# Patient Record
Sex: Male | Born: 1960 | Race: White | Hispanic: No | Marital: Married | Smoking: Never smoker
Health system: Southern US, Community
[De-identification: ages and names within clinical notes are randomized; demographics above are authoritative.]

## PROBLEM LIST (undated history)

## (undated) DIAGNOSIS — E669 Obesity, unspecified: Secondary | ICD-10-CM

## (undated) DIAGNOSIS — E782 Mixed hyperlipidemia: Secondary | ICD-10-CM

## (undated) DIAGNOSIS — F419 Anxiety disorder, unspecified: Secondary | ICD-10-CM

## (undated) DIAGNOSIS — K589 Irritable bowel syndrome without diarrhea: Secondary | ICD-10-CM

## (undated) DIAGNOSIS — F439 Reaction to severe stress, unspecified: Secondary | ICD-10-CM

## (undated) DIAGNOSIS — K429 Umbilical hernia without obstruction or gangrene: Secondary | ICD-10-CM

## (undated) DIAGNOSIS — I10 Essential (primary) hypertension: Secondary | ICD-10-CM

## (undated) HISTORY — DX: Obesity, unspecified: E66.9

## (undated) HISTORY — PX: COLONOSCOPY: SHX174

## (undated) HISTORY — PX: OTHER SURGICAL HISTORY: SHX169

## (undated) HISTORY — DX: Umbilical hernia without obstruction or gangrene: K42.9

## (undated) HISTORY — DX: Mixed hyperlipidemia: E78.2

## (undated) HISTORY — DX: Reaction to severe stress, unspecified: F43.9

## (undated) HISTORY — DX: Essential (primary) hypertension: I10

---

## 2014-12-24 ENCOUNTER — Telehealth: Payer: Self-pay

## 2014-12-24 NOTE — Telephone Encounter (Signed)
Scheduling not able to get in touch with patient. Chart prep team has records from [facility]

## 2015-03-18 ENCOUNTER — Encounter: Payer: Self-pay | Admitting: *Deleted

## 2015-03-22 ENCOUNTER — Ambulatory Visit (INDEPENDENT_AMBULATORY_CARE_PROVIDER_SITE_OTHER): Payer: BLUE CROSS/BLUE SHIELD | Admitting: Cardiology

## 2015-03-22 ENCOUNTER — Encounter: Payer: Self-pay | Admitting: Cardiology

## 2015-03-22 VITALS — BP 170/100 | HR 69 | Ht 69.5 in | Wt 236.8 lb

## 2015-03-22 DIAGNOSIS — I10 Essential (primary) hypertension: Secondary | ICD-10-CM

## 2015-03-22 DIAGNOSIS — R011 Cardiac murmur, unspecified: Secondary | ICD-10-CM | POA: Diagnosis not present

## 2015-03-22 DIAGNOSIS — I450 Right fascicular block: Secondary | ICD-10-CM | POA: Diagnosis not present

## 2015-03-22 DIAGNOSIS — R079 Chest pain, unspecified: Secondary | ICD-10-CM

## 2015-03-22 DIAGNOSIS — I4510 Unspecified right bundle-branch block: Secondary | ICD-10-CM | POA: Insufficient documentation

## 2015-03-22 MED ORDER — METOPROLOL SUCCINATE ER 25 MG PO TB24: 25.0000 mg | ORAL_TABLET | Freq: Every day | ORAL | Status: DC

## 2015-03-22 NOTE — Progress Notes (Signed)
Cardiology Office Note   Date:  03/22/2015   ID:  Brandon Watkins, DOB 09/09/1961, MRN [id number]  PCP:  Gennette Pac, MD  Cardiologist: Darlin Coco MD  No chief complaint on file.     History of Present Illness: Brandon Watkins is a 54 y.o. male who presents for cardiovascular evaluation.  The patient is a medical patient of Dr. Hulan Fess.  He was recently seen by Dr. Yaakov Guthrie.  He comes in because of his concern over occasional left-sided chest discomfort.  It does not appear to be related to physical exertion.  He wonders if it could be related to stress.  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  He has a history of high blood pressure and has been on high blood pressure medicine for the past 2 years.  He also has a history of significant hypercholesterolemia in the past.  His lipid values have improved on a combination of atorvastatin and ezetimibe.  Patient has also had occasional sharp pains in the right side of his neck.  He complains that it feels like his heart is having to work too hard.  He is a former Printmaker.  He estimates that he has gained about 40 pounds in the past 5 years.  Years ago he was running marathons.  He still works out with a Clinical research associate on a regular basis and tries to do aerobic exercise in the gym on a regular basis.  He has had occasional racing of his heart.  He has been told recently of the heart murmur.  He has not had any episodes of syncope. His family history reveals that his father is living but had bypass surgery.  His mother is alive and has high blood pressure and hypercholesterolemia.    Past Medical History  Diagnosis Date  . Mixed dyslipidemia   . Essential hypertension   . Umbilical hernia   . Obesity   . Stress     Past Surgical History  Procedure Laterality Date  . Repair of digital nerve    . Colonoscopy       Current Outpatient Prescriptions    Medication Sig Dispense Refill  . Ascorbic Acid (VITAMIN C) 1000 MG tablet Take 3,000 mg by mouth daily.    Marland Kitchen aspirin 81 MG chewable tablet Chew 81 mg by mouth daily.    Marland Kitchen atorvastatin (LIPITOR) 40 MG tablet Take 40 mg by mouth daily.    . Cholecalciferol 5000 UNITS capsule Take 5,000 Units by mouth 3 (three) times a week.    . Coenzyme Q10 (CO Q-10) 100 MG CAPS Take 100 mg by mouth daily.    Marland Kitchen DHEA 10 MG TABS Take 10 mg by mouth daily.    Marland Kitchen escitalopram (LEXAPRO) 10 MG tablet Take 10 mg by mouth daily.    Marland Kitchen ezetimibe (ZETIA) 10 MG tablet Take 10 mg by mouth daily.    Marland Kitchen losartan (COZAAR) 100 MG tablet Take 100 mg by mouth daily.    . Multiple Vitamin (MULTIVITAMIN) tablet Take 1 tablet by mouth daily.    . Omega-3 Fatty Acids (FISH OIL) 1000 MG CAPS Take 1,000 mg by mouth 3 (three) times a week.    Marland Kitchen OVER THE COUNTER MEDICATION Take by mouth 2 (two) times daily. ( GABA CALM) HALF A TABLET BY MOUTH TWICE DAILY    . Probiotic Product (PROBIOTIC DAILY PO) Take by mouth daily. ULTRA FLORA    .  metoprolol succinate (TOPROL XL) 25 MG 24 hr tablet Take 1 tablet (25 mg total) by mouth daily. 90 tablet 1   No current facility-administered medications for this visit.    Allergies:   Lipitor and Prednisone    Social History:  The patient  reports that he has never smoked. He does not have any smokeless tobacco history on file. He reports that he drinks alcohol. He reports that he does not use illicit drugs.   Family History:  The patient's family history includes Diabetes in his sister; Hyperlipidemia in his mother; Hypertension in his father and mother.    ROS:  Please see the history of present illness.   Otherwise, review of systems are positive for none.   All other systems are reviewed and negative.    PHYSICAL EXAM: VS:  BP 170/100 mmHg  Pulse 69  Ht 5' 9.5" (1.765 m)  Wt 236 lb 12.8 oz (107.412 kg)  BMI 34.48 kg/m2 , BMI Body mass index is 34.48 kg/(m^2). GEN: Well nourished, well  developed, in no acute distress HEENT: normal Neck: no JVD, carotid bruits, or masses Cardiac: RRR; there is a soft systolic ejection murmur at the base.  No diastolic murmur.  No gallop or rub.  Pedal pulses are strong. Respiratory:  clear to auscultation bilaterally, normal work of breathing GI: soft, nontender, nondistended, + BS MS: no deformity or atrophy Skin: warm and dry, no rash Neuro:  Strength and sensation are intact Psych: euthymic mood, full affect   EKG:  EKG is ordered today. The ekg ordered today demonstrates normal sinus rhythm.  Incomplete right bundle branch block.   Recent Labs: No results found for requested labs within last 365 days.    Lipid Panel No results found for: CHOL, TRIG, HDL, CHOLHDL, VLDL, LDLCALC, LDLDIRECT    Wt Readings from Last 3 Encounters:  03/22/15 236 lb 12.8 oz (107.412 kg)      Other studies Reviewed: Additional studies/ records that were reviewed today include: Outside records from [facility] dated 02/15/15. Review of the above records demonstrates: Normal renal function.   ASSESSMENT AND PLAN:  1.  Atypical chest pain with multiple risk factors for premature coronary disease) hypercholesterolemia and essential hypertension.  The patient is a nonsmoker. 2.  Hypercholesterolemia on atorvastatin and ezetimibe 3.  Intermittent palpitations and sensation of forceful heartbeat typically occurring at rest 4.  Incomplete right bundle branch block 5.  Systolic heart murmur loudest at the aortic area.  Current medicines are reviewed at length with the patient today.  The patient does not have concerns regarding medicines.  The following changes have been made:  Because of blood pressure still not down to go we have added Toprol-XL 25 mg one daily to help his blood pressure and his sense of forceful heartbeat.  Labs/ tests ordered today include:   Orders Placed This Encounter  Procedures  . Myocardial Perfusion Imaging  .  EKG 12-Lead  . Echocardiogram    Disposition: Continue losartan 100 mg daily and start metoprolol succinate 25 mg daily.  Avoid added salt in diet. Return for a two-dimensional echocardiogram to evaluate his heart murmur. Return for a treadmill Myoview stress test to evaluate his left-sided chest discomfort. Recheck in 6 weeks or sooner when necessary Continue baby aspirin 81 mg daily  Signed, Darlin Coco MD 03/22/2015 4:50 PM    [address], [place], [place]  [address] Phone: [phone]; Fax: [phone]

## 2015-03-22 NOTE — Patient Instructions (Signed)
Medication Instructions:  START TOPROL XL (METOPROLOL) 25 MG DAILY  Labwork: NONE  Testing/Procedures: Your physician has requested that you have an echocardiogram. Echocardiography is a painless test that uses sound waves to create images of your heart. It provides your doctor with information about the size and shape of your heart and how well your heart's chambers and valves are working. This procedure takes approximately one hour. There are no restrictions for this procedure.  Your physician has requested that you have en exercise stress myoview. For further information please visit [email or web address]. Please follow instruction sheet, as given.  Follow-Up: 6 WEEKS OV   Any Other Special Instructions Will Be Listed Below (If Applicable). Low-Sodium Eating Plan Sodium raises blood pressure and causes water to be held in the body. Getting less sodium from food will help lower your blood pressure, reduce any swelling, and protect your heart, liver, and kidneys. We get sodium by adding salt (sodium chloride) to food. Most of our sodium comes from canned, boxed, and frozen foods. Restaurant foods, fast foods, and pizza are also very high in sodium. Even if you take medicine to lower your blood pressure or to reduce fluid in your body, getting less sodium from your food is important. WHAT IS MY PLAN? Most people should limit their sodium intake to 2,300 mg a day. Your health care provider recommends that you limit your sodium intake to __________ a day.  WHAT DO I NEED TO KNOW ABOUT THIS EATING PLAN? For the low-sodium eating plan, you will follow these general guidelines:  Choose foods with a % Daily Value for sodium of less than 5% (as listed on the food label).   Use salt-free seasonings or herbs instead of table salt or sea salt.   Check with your health care provider or pharmacist before using salt substitutes.   Eat fresh foods.  Eat more vegetables and fruits.  Limit canned  vegetables. If you do use them, rinse them well to decrease the sodium.   Limit cheese to 1 oz (28 g) per day.   Eat lower-sodium products, often labeled as "lower sodium" or "no salt added."  Avoid foods that contain monosodium glutamate (MSG). MSG is sometimes added to [place] food and some canned foods.  Check food labels (Nutrition Facts labels) on foods to learn how much sodium is in one serving.  Eat more home-cooked food and less restaurant, buffet, and fast food.  When eating at a restaurant, ask that your food be prepared with less salt or none, if possible.  HOW DO I READ FOOD LABELS FOR SODIUM INFORMATION? The Nutrition Facts label lists the amount of sodium in one serving of the food. If you eat more than one serving, you must multiply the listed amount of sodium by the number of servings. Food labels may also identify foods as:  Sodium free--Less than 5 mg in a serving.  Very low sodium--35 mg or less in a serving.  Low sodium--140 mg or less in a serving.  Light in sodium--50% less sodium in a serving. For example, if a food that usually has 300 mg of sodium is changed to become light in sodium, it will have 150 mg of sodium.  Reduced sodium--25% less sodium in a serving. For example, if a food that usually has 400 mg of sodium is changed to reduced sodium, it will have 300 mg of sodium. WHAT FOODS CAN I EAT? Grains Low-sodium cereals, including oats, puffed wheat and rice, and shredded wheat cereals. Low-sodium  crackers. Unsalted rice and pasta. Lower-sodium bread.  Vegetables Frozen or fresh vegetables. Low-sodium or reduced-sodium canned vegetables. Low-sodium or reduced-sodium tomato sauce and paste. Low-sodium or reduced-sodium tomato and vegetable juices.  Fruits Fresh, frozen, and canned fruit. Fruit juice.  Meat and Other Protein Products Low-sodium canned tuna and salmon. Fresh or frozen meat, poultry, seafood, and fish. Lamb. Unsalted nuts. Dried  beans, peas, and lentils without added salt. Unsalted canned beans. Homemade soups without salt. Eggs.  Dairy Milk. Soy milk. Ricotta cheese. Low-sodium or reduced-sodium cheeses. Yogurt.  Condiments Fresh and dried herbs and spices. Salt-free seasonings. Onion and garlic powders. Low-sodium varieties of mustard and ketchup. Lemon juice.  Fats and Oils Reduced-sodium salad dressings. Unsalted butter.  Other Unsalted popcorn and pretzels.  The items listed above may not be a complete list of recommended foods or beverages. Contact your dietitian for more options. WHAT FOODS ARE NOT RECOMMENDED? Grains Instant hot cereals. Bread stuffing, pancake, and biscuit mixes. Croutons. Seasoned rice or pasta mixes. Noodle soup cups. Boxed or frozen macaroni and cheese. Self-rising flour. Regular salted crackers. Vegetables Regular canned vegetables. Regular canned tomato sauce and paste. Regular tomato and vegetable juices. Frozen vegetables in sauces. Salted french fries. Olives. Angie Fava. Relishes. Sauerkraut. Salsa. Meat and Other Protein Products Salted, canned, smoked, spiced, or pickled meats, seafood, or fish. Bacon, ham, sausage, hot dogs, corned beef, chipped beef, and packaged luncheon meats. Salt pork. Jerky. Pickled herring. Anchovies, regular canned tuna, and sardines. Salted nuts. Dairy Processed cheese and cheese spreads. Cheese curds. Blue cheese and cottage cheese. Buttermilk.  Condiments Onion and garlic salt, seasoned salt, table salt, and sea salt. Canned and packaged gravies. Worcestershire sauce. Tartar sauce. Barbecue sauce. Teriyaki sauce. Soy sauce, including reduced sodium. Steak sauce. Fish sauce. Oyster sauce. Cocktail sauce. Horseradish. Regular ketchup and mustard. Meat flavorings and tenderizers. Bouillon cubes. Hot sauce. Tabasco sauce. Marinades. Taco seasonings. Relishes. Fats and Oils Regular salad dressings. Salted butter. Margarine. Ghee. Bacon fat.   Other Potato and tortilla chips. Corn chips and puffs. Salted popcorn and pretzels. Canned or dried soups. Pizza. Frozen entrees and pot pies.  The items listed above may not be a complete list of foods and beverages to avoid. Contact your dietitian for more information. Document Released: 04/20/2002 Document Revised: 11/03/2013 Document Reviewed: 09/02/2013 [facility] Patient Information 2015 [place], [place]. This information is not intended to replace advice given to you by your health care provider. Make sure you discuss any questions you have with your health care provider.

## 2015-04-04 ENCOUNTER — Telehealth (HOSPITAL_COMMUNITY): Payer: Self-pay

## 2015-04-04 NOTE — Telephone Encounter (Signed)
Left message on voicemail in reference to upcoming appointment scheduled for 04-05-2015. Phone number given for a call back so details instructions can be given. Brandon Watkins, [name]

## 2015-04-05 ENCOUNTER — Ambulatory Visit (HOSPITAL_COMMUNITY): Payer: BLUE CROSS/BLUE SHIELD | Attending: Cardiovascular Disease

## 2015-04-05 ENCOUNTER — Other Ambulatory Visit: Payer: Self-pay

## 2015-04-05 ENCOUNTER — Ambulatory Visit (HOSPITAL_BASED_OUTPATIENT_CLINIC_OR_DEPARTMENT_OTHER): Payer: BLUE CROSS/BLUE SHIELD

## 2015-04-05 DIAGNOSIS — R011 Cardiac murmur, unspecified: Secondary | ICD-10-CM

## 2015-04-05 DIAGNOSIS — Z8249 Family history of ischemic heart disease and other diseases of the circulatory system: Secondary | ICD-10-CM | POA: Insufficient documentation

## 2015-04-05 DIAGNOSIS — R079 Chest pain, unspecified: Secondary | ICD-10-CM | POA: Diagnosis not present

## 2015-04-05 DIAGNOSIS — E785 Hyperlipidemia, unspecified: Secondary | ICD-10-CM | POA: Diagnosis not present

## 2015-04-05 DIAGNOSIS — I4510 Unspecified right bundle-branch block: Secondary | ICD-10-CM | POA: Diagnosis not present

## 2015-04-05 DIAGNOSIS — I10 Essential (primary) hypertension: Secondary | ICD-10-CM | POA: Insufficient documentation

## 2015-04-05 LAB — MYOCARDIAL PERFUSION IMAGING
CHL CUP MPHR: 167 {beats}/min
CHL CUP RESTING HR STRESS: 69 {beats}/min
CHL CUP STRESS STAGE 1 SBP: 138 mmHg
CHL CUP STRESS STAGE 10 DBP: 86 mmHg
CHL CUP STRESS STAGE 2 SBP: 151 mmHg
CHL CUP STRESS STAGE 2 SPEED: 0 mph
CHL CUP STRESS STAGE 3 SPEED: 0 mph
CHL CUP STRESS STAGE 4 SPEED: 1.7 mph
CHL CUP STRESS STAGE 5 DBP: 85 mmHg
CHL CUP STRESS STAGE 5 SPEED: 2.5 mph
CHL CUP STRESS STAGE 6 GRADE: 14 %
CHL CUP STRESS STAGE 7 SPEED: 4.2 mph
CHL CUP STRESS STAGE 8 HR: 148 {beats}/min
CHL CUP STRESS STAGE 9 DBP: 108 mmHg
CHL CUP STRESS STAGE 9 SBP: 229 mmHg
CSEPHR: 88 %
CSEPPMHR: 88 %
Estimated workload: 14.3 METS
Exercise duration (min): 12 min
Exercise duration (sec): 30 s
LHR: 0.36
LV dias vol: 119 mL
LVSYSVOL: 44 mL
Nuc Stress EF: 63 %
Peak HR: 148 {beats}/min
RPE: 19
SDS: 2
SRS: 4
SSS: 6
Stage 1 DBP: 91 mmHg
Stage 1 Grade: 0 %
Stage 1 HR: 69 {beats}/min
Stage 1 Speed: 0 mph
Stage 10 Grade: 0 %
Stage 10 HR: 85 {beats}/min
Stage 10 SBP: 145 mmHg
Stage 10 Speed: 0 mph
Stage 2 DBP: 96 mmHg
Stage 2 Grade: 0 %
Stage 2 HR: 65 {beats}/min
Stage 3 Grade: 0 %
Stage 3 HR: 64 {beats}/min
Stage 4 Grade: 10 %
Stage 4 HR: 85 {beats}/min
Stage 5 Grade: 12 %
Stage 5 HR: 98 {beats}/min
Stage 5 SBP: 178 mmHg
Stage 6 DBP: 92 mmHg
Stage 6 HR: 120 {beats}/min
Stage 6 SBP: 213 mmHg
Stage 6 Speed: 3.4 mph
Stage 7 Grade: 16 %
Stage 7 HR: 144 {beats}/min
Stage 8 Grade: 18 %
Stage 8 Speed: 5 mph
Stage 9 Grade: 0 %
Stage 9 HR: 126 {beats}/min
Stage 9 Speed: 0 mph
TID: 0.72

## 2015-04-05 MED ORDER — TECHNETIUM TC 99M SESTAMIBI GENERIC - CARDIOLITE
11.0000 | Freq: Once | INTRAVENOUS | Status: AC | PRN
  Administered 2015-04-05: 11 via INTRAVENOUS

## 2015-04-05 MED ORDER — TECHNETIUM TC 99M SESTAMIBI GENERIC - CARDIOLITE
33.0000 | Freq: Once | INTRAVENOUS | Status: AC | PRN
  Administered 2015-04-05: 33 via INTRAVENOUS

## 2015-04-06 ENCOUNTER — Telehealth: Payer: Self-pay | Admitting: *Deleted

## 2015-04-06 NOTE — Telephone Encounter (Signed)
Advised patient of echo and myoview

## 2015-04-06 NOTE — Telephone Encounter (Signed)
-----   Message from Darlin Coco, MD sent at 04/05/2015  6:46 PM EDT ----- Please report.  The nuclear stress test is normal.  Exercise tolerance was excellent.  No evidence of ischemia.  Send a copy of the report to Dr. Hulan Fess.

## 2015-04-06 NOTE — Telephone Encounter (Signed)
-----   Message from Darlin Coco, MD sent at 04/05/2015 12:57 PM EDT ----- Please report.  The echocardiogram is normal.  No significant valve problems.  Left ventricular function is normal.  Please send a copy  to Dr. Hulan Fess. We are still awaiting his stress test.

## 2015-05-06 ENCOUNTER — Ambulatory Visit: Payer: BLUE CROSS/BLUE SHIELD | Admitting: Cardiology

## 2015-11-27 ENCOUNTER — Other Ambulatory Visit: Payer: Self-pay | Admitting: Cardiology

## 2015-12-16 ENCOUNTER — Ambulatory Visit (INDEPENDENT_AMBULATORY_CARE_PROVIDER_SITE_OTHER): Payer: BLUE CROSS/BLUE SHIELD | Admitting: Cardiology

## 2015-12-16 ENCOUNTER — Encounter: Payer: Self-pay | Admitting: Cardiology

## 2015-12-16 VITALS — BP 140/90 | HR 72 | Ht 69.5 in | Wt 243.1 lb

## 2015-12-16 DIAGNOSIS — E7800 Pure hypercholesterolemia, unspecified: Secondary | ICD-10-CM

## 2015-12-16 DIAGNOSIS — I10 Essential (primary) hypertension: Secondary | ICD-10-CM

## 2015-12-16 MED ORDER — METOPROLOL SUCCINATE ER 25 MG PO TB24: 25.0000 mg | ORAL_TABLET | Freq: Every day | ORAL | Status: DC

## 2015-12-16 NOTE — Patient Instructions (Signed)
Medication Instructions:  Your physician recommends that you continue on your current medications as directed. Please refer to the Current Medication list given to you today.  Labwork: none  Testing/Procedures: none  Follow-Up: As needed   If you need a refill on your cardiac medications before your next appointment, please call your pharmacy.  

## 2015-12-16 NOTE — Progress Notes (Signed)
Cardiology Office Note   Date:  12/16/2015   ID:  Brandon Watkins, DOB [birth year], MRN [id number]  PCP:  Gennette Pac, MD  Cardiologist: Darlin Coco MD  No chief complaint on file.     History of Present Illness: Brandon Watkins is a 55 y.o. male who presents for X month follow-up office visit   The patient is a medical patient of Dr. Hulan Fess. Marland Kitchen He came in in May 2016 because of his concern over occasional left-sided chest discomfort. It does not appear to be related to physical exertion.  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 He has a history of high blood pressure and has been on high blood pressure medicine for the past 2 years. He also has a history of significant hypercholesterolemia in the past.He reports that he has had a good response of his serum cholesterol to Lipitor.  He is no longer on ezetimibe.  He is not having any myalgias from the statin therapy. He is not having any exertional chest pain.  His blood pressure at home has occasionally been in the 160/90 range.  He has been out of his beta blocker for the past 2 weeks. His family history reveals that his father is living but had bypass surgery. His mother is alive and has high blood pressure and hypercholesterolemia.  Past Medical History  Diagnosis Date  . Mixed dyslipidemia   . Essential hypertension   . Umbilical hernia   . Obesity   . Stress     Past Surgical History  Procedure Laterality Date  . Repair of digital nerve    . Colonoscopy       Current Outpatient Prescriptions  Medication Sig Dispense Refill  . aspirin 81 MG chewable tablet Chew 81 mg by mouth daily.    Marland Kitchen atorvastatin (LIPITOR) 40 MG tablet Take 40 mg by mouth daily.    Marland Kitchen escitalopram (LEXAPRO) 10 MG tablet Take 10 mg by mouth daily.    Marland Kitchen losartan-hydrochlorothiazide (HYZAAR) 100-25 MG tablet Take 1 tablet by mouth daily.   0  . metoprolol  succinate (TOPROL XL) 25 MG 24 hr tablet Take 1 tablet (25 mg total) by mouth daily. 90 tablet 3  . Multiple Vitamin (MULTIVITAMIN) tablet Take 1 tablet by mouth daily.    . Omega-3 Fatty Acids (FISH OIL) 1000 MG CAPS Take 1,000 mg by mouth 3 (three) times a week.    Marland Kitchen OVER THE COUNTER MEDICATION Take 1 Dose by mouth daily. ( GABA CALM)     No current facility-administered medications for this visit.    Allergies:   Lipitor and Prednisone    Social History:  The patient  reports that he has never smoked. He does not have any smokeless tobacco history on file. He reports that he drinks alcohol. He reports that he does not use illicit drugs.   Family History:  The patient's family history includes Diabetes in his sister; Hyperlipidemia in his mother; Hypertension in his father and mother.    ROS:  Please see the history of present illness.   Otherwise, review of systems are positive for none.   All other systems are reviewed and negative.    PHYSICAL EXAM: VS:  BP 140/90 mmHg  Pulse 72  Ht 5' 9.5" (1.765 m)  Wt 243 lb 1.9 oz (110.279 kg)  BMI 35.40 kg/m2 , BMI Body mass index is 35.4 kg/(m^2). GEN: Well nourished,  well developed, in no acute distress HEENT: normal Neck: no JVD, carotid bruits, or masses Cardiac: RRR; no murmurs, rubs, or gallops,no edema  Respiratory:  clear to auscultation bilaterally, normal work of breathing GI: soft, nontender, nondistended, + BS MS: no deformity or atrophy Skin: warm and dry, no rash Neuro:  Strength and sensation are intact Psych: euthymic mood, full affect   EKG:  EKG is not ordered today.    Recent Labs: No results found for requested labs within last 365 days.    Lipid Panel No results found for: CHOL, TRIG, HDL, CHOLHDL, VLDL, LDLCALC, LDLDIRECT    Wt Readings from Last 3 Encounters:  12/16/15 243 lb 1.9 oz (110.279 kg)  04/05/15 238 lb (107.956 kg)  03/22/15 236 lb 12.8 oz (107.412 kg)      Other studies  Reviewed: Additional studies/ records that were reviewed today include: . Review of the above records demonstrates:  Echocardiogram on 04/05/15: - Left ventricle: The cavity size was normal. Systolic function was normal. The estimated ejection fraction was in the range of 50% to 55%. Wall motion was normal; there were no regional wall motion abnormalities. - Atrial septum: No defect or patent foramen ovale was identified.  Treadmill Myoview on 04/05/15:  Myocardial perfusion is normal. The study is normal. This is a low risk study. Overall left ventricular systolic function was normal. The left ventricular ejection fraction is normal (55-65%). No ischemia identified  Excellent exercise tolerance. Normal exercise treadmill test.   ASSESSMENT AND PLAN:  1.  Atypical chest pain, improved with better control of blood pressure.  Previous Myoview and echocardiogram unremarkable 2.  Essential hypertension.  Blood pressure today 140/90 having not been on any beta blocker for 2 weeks.  Resume beta blocker in the form of Toprol-XL 25 one daily.  Continue close follow-up of blood pressure with Dr. Rex Kras 3.  Hypercholesterolemia.  Improving on statin therapy.  Continue with statin as per Dr. Rex Kras   Current medicines are reviewed at length with the patient today.  The patient does not have concerns regarding medicines.  The following changes have been made:  no change  Labs/ tests ordered today include:  No orders of the defined types were placed in this encounter.   We refilled his metoprolol succinate today. Not have any evidence of coronary disease.  He will continue close follow-up with Dr. Rex Kras and return here on a when necessary basis. His weight is up 7 pounds since last year and I encouraged him to try to lose weight and to continue regular exercise.  Berna Spare MD 12/16/2015 5:32 PM    [address], [place], [place]   [address] Phone: [phone]; Fax: [phone]

## 2015-12-23 ENCOUNTER — Ambulatory Visit: Payer: BLUE CROSS/BLUE SHIELD | Admitting: Cardiology

## 2018-12-16 DIAGNOSIS — F411 Generalized anxiety disorder: Secondary | ICD-10-CM | POA: Diagnosis not present

## 2018-12-16 DIAGNOSIS — E785 Hyperlipidemia, unspecified: Secondary | ICD-10-CM | POA: Diagnosis not present

## 2018-12-16 DIAGNOSIS — I10 Essential (primary) hypertension: Secondary | ICD-10-CM | POA: Diagnosis not present

## 2018-12-24 DIAGNOSIS — K429 Umbilical hernia without obstruction or gangrene: Secondary | ICD-10-CM | POA: Diagnosis not present

## 2019-01-01 ENCOUNTER — Ambulatory Visit: Payer: Self-pay | Admitting: General Surgery

## 2019-01-29 DIAGNOSIS — K625 Hemorrhage of anus and rectum: Secondary | ICD-10-CM | POA: Diagnosis not present

## 2019-01-29 DIAGNOSIS — K51811 Other ulcerative colitis with rectal bleeding: Secondary | ICD-10-CM | POA: Diagnosis not present

## 2019-02-02 DIAGNOSIS — K921 Melena: Secondary | ICD-10-CM | POA: Diagnosis not present

## 2019-02-09 DIAGNOSIS — K5289 Other specified noninfective gastroenteritis and colitis: Secondary | ICD-10-CM | POA: Diagnosis not present

## 2019-02-09 DIAGNOSIS — K625 Hemorrhage of anus and rectum: Secondary | ICD-10-CM | POA: Diagnosis not present

## 2019-02-09 DIAGNOSIS — K626 Ulcer of anus and rectum: Secondary | ICD-10-CM | POA: Diagnosis not present

## 2019-02-09 DIAGNOSIS — K633 Ulcer of intestine: Secondary | ICD-10-CM | POA: Diagnosis not present

## 2019-02-17 DIAGNOSIS — K5289 Other specified noninfective gastroenteritis and colitis: Secondary | ICD-10-CM | POA: Diagnosis not present

## 2019-02-17 DIAGNOSIS — K6289 Other specified diseases of anus and rectum: Secondary | ICD-10-CM | POA: Diagnosis not present

## 2019-03-25 ENCOUNTER — Encounter (HOSPITAL_BASED_OUTPATIENT_CLINIC_OR_DEPARTMENT_OTHER): Payer: Self-pay | Admitting: *Deleted

## 2019-03-25 ENCOUNTER — Other Ambulatory Visit: Payer: Self-pay

## 2019-03-26 ENCOUNTER — Other Ambulatory Visit: Payer: Self-pay

## 2019-03-26 ENCOUNTER — Other Ambulatory Visit (HOSPITAL_COMMUNITY)
Admission: RE | Admit: 2019-03-26 | Discharge: 2019-03-26 | Disposition: A | Discharge status: Home / Self Care | Payer: BLUE CROSS/BLUE SHIELD | Source: Ambulatory Visit | Attending: General Surgery | Admitting: General Surgery

## 2019-03-26 ENCOUNTER — Encounter (HOSPITAL_BASED_OUTPATIENT_CLINIC_OR_DEPARTMENT_OTHER)
Admission: RE | Admit: 2019-03-26 | Discharge: 2019-03-26 | Disposition: A | Discharge status: Home / Self Care | Payer: BLUE CROSS/BLUE SHIELD | Source: Ambulatory Visit | Attending: General Surgery | Admitting: General Surgery

## 2019-03-26 DIAGNOSIS — Z1159 Encounter for screening for other viral diseases: Secondary | ICD-10-CM | POA: Insufficient documentation

## 2019-03-26 DIAGNOSIS — Z01818 Encounter for other preprocedural examination: Secondary | ICD-10-CM | POA: Insufficient documentation

## 2019-03-26 DIAGNOSIS — I4510 Unspecified right bundle-branch block: Secondary | ICD-10-CM | POA: Insufficient documentation

## 2019-03-26 LAB — BASIC METABOLIC PANEL
Anion gap: 11 (ref 5–15)
BUN: 15 mg/dL (ref 6–20)
CO2: 27 mmol/L (ref 22–32)
Calcium: 9.5 mg/dL (ref 8.9–10.3)
Chloride: 104 mmol/L (ref 98–111)
Creatinine, Ser: 1.14 mg/dL (ref 0.61–1.24)
GFR calc Af Amer: >60 mL/min (ref >60)
GFR calc non Af Amer: >60 mL/min (ref >60)
Glucose, Bld: 88 mg/dL (ref 70–99)
Potassium: 3.9 mmol/L (ref 3.5–5.1)
Sodium: 142 mmol/L (ref 135–145)

## 2019-03-26 NOTE — Progress Notes (Signed)
Pt given and instructed to drink Ensure by 0630 day of surgery with teach back method. Dr Marcell Barlow reviewed EKG - ok for surgery. Explained to pt about right bundle branch block - he was anxious, told him I had to have it reviewed by Anesthesia. Pt a little calmer after.

## 2019-03-27 LAB — NOVEL CORONAVIRUS, NAA (HOSP ORDER, SEND-OUT TO REF LAB; TAT 18-24 HRS): SARS-CoV-2, NAA: NOT DETECTED

## 2019-03-30 ENCOUNTER — Other Ambulatory Visit: Payer: Self-pay

## 2019-03-30 ENCOUNTER — Encounter (HOSPITAL_BASED_OUTPATIENT_CLINIC_OR_DEPARTMENT_OTHER): Payer: Self-pay | Admitting: Certified Registered"

## 2019-03-30 ENCOUNTER — Ambulatory Visit (HOSPITAL_BASED_OUTPATIENT_CLINIC_OR_DEPARTMENT_OTHER): Payer: BLUE CROSS/BLUE SHIELD | Admitting: Certified Registered"

## 2019-03-30 ENCOUNTER — Ambulatory Visit (HOSPITAL_BASED_OUTPATIENT_CLINIC_OR_DEPARTMENT_OTHER)
Admission: RE | Admit: 2019-03-30 | Discharge: 2019-03-30 | Disposition: A | Discharge status: Home / Self Care | Payer: BLUE CROSS/BLUE SHIELD | Attending: General Surgery | Admitting: General Surgery

## 2019-03-30 ENCOUNTER — Encounter (HOSPITAL_BASED_OUTPATIENT_CLINIC_OR_DEPARTMENT_OTHER)
Admission: RE | Disposition: A | Discharge status: Home / Self Care | Payer: Self-pay | Source: Home / Self Care | Attending: General Surgery

## 2019-03-30 DIAGNOSIS — K429 Umbilical hernia without obstruction or gangrene: Secondary | ICD-10-CM | POA: Insufficient documentation

## 2019-03-30 DIAGNOSIS — E7800 Pure hypercholesterolemia, unspecified: Secondary | ICD-10-CM | POA: Diagnosis not present

## 2019-03-30 DIAGNOSIS — I10 Essential (primary) hypertension: Secondary | ICD-10-CM | POA: Insufficient documentation

## 2019-03-30 DIAGNOSIS — Z6834 Body mass index (BMI) 34.0-34.9, adult: Secondary | ICD-10-CM | POA: Insufficient documentation

## 2019-03-30 DIAGNOSIS — Z79899 Other long term (current) drug therapy: Secondary | ICD-10-CM | POA: Diagnosis not present

## 2019-03-30 DIAGNOSIS — E669 Obesity, unspecified: Secondary | ICD-10-CM | POA: Insufficient documentation

## 2019-03-30 DIAGNOSIS — F419 Anxiety disorder, unspecified: Secondary | ICD-10-CM | POA: Insufficient documentation

## 2019-03-30 DIAGNOSIS — Z8249 Family history of ischemic heart disease and other diseases of the circulatory system: Secondary | ICD-10-CM | POA: Insufficient documentation

## 2019-03-30 DIAGNOSIS — I4510 Unspecified right bundle-branch block: Secondary | ICD-10-CM | POA: Diagnosis not present

## 2019-03-30 DIAGNOSIS — E785 Hyperlipidemia, unspecified: Secondary | ICD-10-CM | POA: Insufficient documentation

## 2019-03-30 HISTORY — DX: Anxiety disorder, unspecified: F41.9

## 2019-03-30 HISTORY — PX: UMBILICAL HERNIA REPAIR: SHX196

## 2019-03-30 HISTORY — DX: Irritable bowel syndrome, unspecified: K58.9

## 2019-03-30 SURGERY — REPAIR, HERNIA, UMBILICAL, ADULT
Anesthesia: General | Site: Abdomen

## 2019-03-30 MED ORDER — LIDOCAINE 2% (20 MG/ML) 5 ML SYRINGE
INTRAMUSCULAR | Status: DC | PRN
  Administered 2019-03-30: 80 mg via INTRAVENOUS

## 2019-03-30 MED ORDER — EPHEDRINE SULFATE-NACL 50-0.9 MG/10ML-% IV SOSY
PREFILLED_SYRINGE | INTRAVENOUS | Status: DC | PRN
  Administered 2019-03-30 (×3): 10 mg via INTRAVENOUS

## 2019-03-30 MED ORDER — PHENYLEPHRINE 40 MCG/ML (10ML) SYRINGE FOR IV PUSH (FOR BLOOD PRESSURE SUPPORT)
PREFILLED_SYRINGE | INTRAVENOUS | Status: DC | PRN
  Administered 2019-03-30: 40 ug via INTRAVENOUS
  Administered 2019-03-30: 80 ug via INTRAVENOUS

## 2019-03-30 MED ORDER — GABAPENTIN 300 MG PO CAPS
300.0000 mg | ORAL_CAPSULE | ORAL | Status: AC
  Administered 2019-03-30: 300 mg via ORAL

## 2019-03-30 MED ORDER — FENTANYL CITRATE (PF) 100 MCG/2ML IJ SOLN
INTRAMUSCULAR | Status: DC | PRN
  Administered 2019-03-30 (×2): 50 ug via INTRAVENOUS

## 2019-03-30 MED ORDER — CEFAZOLIN SODIUM-DEXTROSE 2-4 GM/100ML-% IV SOLN
2.0000 g | INTRAVENOUS | Status: AC
  Administered 2019-03-30: 2 g via INTRAVENOUS

## 2019-03-30 MED ORDER — MIDAZOLAM HCL 2 MG/2ML IJ SOLN: 1.0000 mg | INTRAMUSCULAR | Status: DC | PRN

## 2019-03-30 MED ORDER — PHENYLEPHRINE 40 MCG/ML (10ML) SYRINGE FOR IV PUSH (FOR BLOOD PRESSURE SUPPORT)
PREFILLED_SYRINGE | INTRAVENOUS | Status: AC
  Filled 2019-03-30: qty 30

## 2019-03-30 MED ORDER — BUPIVACAINE-EPINEPHRINE (PF) 0.25% -1:200000 IJ SOLN
INTRAMUSCULAR | Status: AC
  Filled 2019-03-30: qty 30

## 2019-03-30 MED ORDER — LACTATED RINGERS IV SOLN
INTRAVENOUS | Status: DC
  Administered 2019-03-30: 10:00:00 via INTRAVENOUS

## 2019-03-30 MED ORDER — SCOPOLAMINE 1 MG/3DAYS TD PT72
1.0000 | MEDICATED_PATCH | Freq: Once | TRANSDERMAL | Status: DC | PRN
  Administered 2019-03-30: 1.5 mg via TRANSDERMAL

## 2019-03-30 MED ORDER — ROCURONIUM BROMIDE 100 MG/10ML IV SOLN: INTRAVENOUS | Status: DC | PRN

## 2019-03-30 MED ORDER — CHLORHEXIDINE GLUCONATE CLOTH 2 % EX PADS: 6.0000 | MEDICATED_PAD | Freq: Once | CUTANEOUS | Status: DC

## 2019-03-30 MED ORDER — ROCURONIUM BROMIDE 10 MG/ML (PF) SYRINGE
PREFILLED_SYRINGE | INTRAVENOUS | Status: AC
  Filled 2019-03-30: qty 10

## 2019-03-30 MED ORDER — LIDOCAINE-EPINEPHRINE (PF) 1 %-1:200000 IJ SOLN
INTRAMUSCULAR | Status: AC
  Filled 2019-03-30: qty 30

## 2019-03-30 MED ORDER — MIDAZOLAM HCL 5 MG/5ML IJ SOLN
INTRAMUSCULAR | Status: DC | PRN
  Administered 2019-03-30: 2 mg via INTRAVENOUS

## 2019-03-30 MED ORDER — BUPIVACAINE HCL (PF) 0.25 % IJ SOLN
INTRAMUSCULAR | Status: AC
  Filled 2019-03-30: qty 30

## 2019-03-30 MED ORDER — CEFAZOLIN SODIUM-DEXTROSE 2-4 GM/100ML-% IV SOLN
INTRAVENOUS | Status: AC
  Filled 2019-03-30: qty 100

## 2019-03-30 MED ORDER — FENTANYL CITRATE (PF) 100 MCG/2ML IJ SOLN
INTRAMUSCULAR | Status: AC
  Filled 2019-03-30: qty 2

## 2019-03-30 MED ORDER — LIDOCAINE 2% (20 MG/ML) 5 ML SYRINGE
INTRAMUSCULAR | Status: AC
  Filled 2019-03-30: qty 5

## 2019-03-30 MED ORDER — ACETAMINOPHEN 500 MG PO TABS
ORAL_TABLET | ORAL | Status: AC
  Filled 2019-03-30: qty 2

## 2019-03-30 MED ORDER — ROCURONIUM BROMIDE 100 MG/10ML IV SOLN
INTRAVENOUS | Status: DC | PRN
  Administered 2019-03-30: 50 mg via INTRAVENOUS

## 2019-03-30 MED ORDER — CELECOXIB 200 MG PO CAPS
200.0000 mg | ORAL_CAPSULE | ORAL | Status: AC
  Administered 2019-03-30: 200 mg via ORAL

## 2019-03-30 MED ORDER — GABAPENTIN 300 MG PO CAPS
ORAL_CAPSULE | ORAL | Status: AC
  Filled 2019-03-30: qty 1

## 2019-03-30 MED ORDER — ONDANSETRON HCL 4 MG/2ML IJ SOLN
INTRAMUSCULAR | Status: AC
  Filled 2019-03-30: qty 2

## 2019-03-30 MED ORDER — SUCCINYLCHOLINE CHLORIDE 200 MG/10ML IV SOSY: PREFILLED_SYRINGE | INTRAVENOUS | Status: DC | PRN

## 2019-03-30 MED ORDER — ACETAMINOPHEN 500 MG PO TABS
1000.0000 mg | ORAL_TABLET | ORAL | Status: AC
  Administered 2019-03-30: 10:00:00 1000 mg via ORAL

## 2019-03-30 MED ORDER — HYDROCODONE-ACETAMINOPHEN 5-325 MG PO TABS: 1.0000 | ORAL_TABLET | Freq: Four times a day (QID) | ORAL | 0 refills | Status: DC | PRN

## 2019-03-30 MED ORDER — CELECOXIB 200 MG PO CAPS
ORAL_CAPSULE | ORAL | Status: AC
  Filled 2019-03-30: qty 1

## 2019-03-30 MED ORDER — BUPIVACAINE-EPINEPHRINE 0.25% -1:200000 IJ SOLN
INTRAMUSCULAR | Status: DC | PRN
  Administered 2019-03-30: 10 mL

## 2019-03-30 MED ORDER — EPHEDRINE 5 MG/ML INJ
INTRAVENOUS | Status: AC
  Filled 2019-03-30: qty 10

## 2019-03-30 MED ORDER — SCOPOLAMINE 1 MG/3DAYS TD PT72
MEDICATED_PATCH | TRANSDERMAL | Status: AC
  Filled 2019-03-30: qty 1

## 2019-03-30 MED ORDER — SUGAMMADEX SODIUM 200 MG/2ML IV SOLN
INTRAVENOUS | Status: DC | PRN
  Administered 2019-03-30: 200 mg via INTRAVENOUS

## 2019-03-30 MED ORDER — MIDAZOLAM HCL 2 MG/2ML IJ SOLN
INTRAMUSCULAR | Status: AC
  Filled 2019-03-30: qty 2

## 2019-03-30 MED ORDER — FENTANYL CITRATE (PF) 100 MCG/2ML IJ SOLN: 50.0000 ug | INTRAMUSCULAR | Status: DC | PRN

## 2019-03-30 MED ORDER — SUGAMMADEX SODIUM 500 MG/5ML IV SOLN
INTRAVENOUS | Status: AC
  Filled 2019-03-30: qty 5

## 2019-03-30 MED ORDER — ONDANSETRON HCL 4 MG/2ML IJ SOLN
INTRAMUSCULAR | Status: DC | PRN
  Administered 2019-03-30: 4 mg via INTRAVENOUS

## 2019-03-30 MED ORDER — PROPOFOL 10 MG/ML IV BOLUS
INTRAVENOUS | Status: DC | PRN
  Administered 2019-03-30: 200 mg via INTRAVENOUS

## 2019-03-30 SURGICAL SUPPLY — 46 items
BENZOIN TINCTURE PRP APPL 2/3 (GAUZE/BANDAGES/DRESSINGS) IMPLANT
BLADE CLIPPER SURG (BLADE) ×3 IMPLANT
BLADE SURG 15 STRL LF DISP TIS (BLADE) ×1 IMPLANT
BLADE SURG 15 STRL SS (BLADE) ×2
CHLORAPREP W/TINT 26 (MISCELLANEOUS) ×3 IMPLANT
CLOSURE WOUND 1/2 X4 (GAUZE/BANDAGES/DRESSINGS)
COVER BACK TABLE REUSABLE LG (DRAPES) ×3 IMPLANT
COVER MAYO STAND REUSABLE (DRAPES) ×3 IMPLANT
COVER WAND RF STERILE (DRAPES) IMPLANT
DECANTER SPIKE VIAL GLASS SM (MISCELLANEOUS) ×3 IMPLANT
DERMABOND ADVANCED (GAUZE/BANDAGES/DRESSINGS) ×2
DERMABOND ADVANCED .7 DNX12 (GAUZE/BANDAGES/DRESSINGS) ×1 IMPLANT
DRAPE LAPAROTOMY TRNSV 102X78 (DRAPE) ×3 IMPLANT
DRAPE UTILITY XL STRL (DRAPES) ×3 IMPLANT
DRSG TEGADERM 4X4.75 (GAUZE/BANDAGES/DRESSINGS) IMPLANT
ELECT COATED BLADE 2.86 ST (ELECTRODE) ×3 IMPLANT
ELECT REM PT RETURN 9FT ADLT (ELECTROSURGICAL) ×3
ELECTRODE REM PT RTRN 9FT ADLT (ELECTROSURGICAL) ×1 IMPLANT
GAUZE SPONGE 4X4 12PLY STRL LF (GAUZE/BANDAGES/DRESSINGS) IMPLANT
GLOVE BIO SURGEON STRL SZ7 (GLOVE) ×3 IMPLANT
GLOVE BIO SURGEON STRL SZ7.5 (GLOVE) ×3 IMPLANT
GLOVE BIOGEL PI IND STRL 7.5 (GLOVE) ×1 IMPLANT
GLOVE BIOGEL PI INDICATOR 7.5 (GLOVE) ×2
GOWN STRL REUS W/ TWL LRG LVL3 (GOWN DISPOSABLE) ×2 IMPLANT
GOWN STRL REUS W/TWL LRG LVL3 (GOWN DISPOSABLE) ×4
MESH VENTRALEX ST 1-7/10 CRC S (Mesh General) ×3 IMPLANT
NEEDLE HYPO 22GX1.5 SAFETY (NEEDLE) IMPLANT
NEEDLE HYPO 25X1 1.5 SAFETY (NEEDLE) ×3 IMPLANT
NS IRRIG 1000ML POUR BTL (IV SOLUTION) IMPLANT
PACK BASIN DAY SURGERY FS (CUSTOM PROCEDURE TRAY) ×3 IMPLANT
PENCIL BUTTON HOLSTER BLD 10FT (ELECTRODE) ×3 IMPLANT
SLEEVE SCD COMPRESS KNEE MED (MISCELLANEOUS) IMPLANT
SPONGE LAP 18X18 RF (DISPOSABLE) ×3 IMPLANT
STRIP CLOSURE SKIN 1/2X4 (GAUZE/BANDAGES/DRESSINGS) IMPLANT
SUT MON AB 4-0 PC3 18 (SUTURE) ×3 IMPLANT
SUT NOVA NAB DX-16 0-1 5-0 T12 (SUTURE) IMPLANT
SUT NOVA NAB GS-21 1 T12 (SUTURE) ×3 IMPLANT
SUT SILK 3 0 SH 30 (SUTURE) IMPLANT
SUT VIC AB 2-0 SH 27 (SUTURE) ×2
SUT VIC AB 2-0 SH 27XBRD (SUTURE) ×1 IMPLANT
SUT VIC AB 3-0 54X BRD REEL (SUTURE) IMPLANT
SUT VIC AB 3-0 BRD 54 (SUTURE)
SUT VIC AB 3-0 SH 27 (SUTURE)
SUT VIC AB 3-0 SH 27X BRD (SUTURE) IMPLANT
SYR CONTROL 10ML LL (SYRINGE) ×3 IMPLANT
TOWEL GREEN STERILE FF (TOWEL DISPOSABLE) ×3 IMPLANT

## 2019-03-30 NOTE — Anesthesia Preprocedure Evaluation (Signed)
Anesthesia Evaluation    Airway Mallampati: II  TM Distance: >3 FB Neck ROM: Full    Dental no notable dental hx. (+) Teeth Intact   Pulmonary neg pulmonary ROS,    Pulmonary exam normal breath sounds clear to auscultation       Cardiovascular hypertension, Pt. on medications Normal cardiovascular exam+ dysrhythmias + Valvular Problems/Murmurs  Rhythm:Regular Rate:Normal  Incomplete RBBB   Neuro/Psych Anxiety negative neurological ROS     GI/Hepatic Neg liver ROS, IBS   Endo/Other  Hyperlipidemia Obesity  Renal/GU negative Renal ROS  negative genitourinary   Musculoskeletal Umbilical hernia   Abdominal (+) + obese,   Peds  Hematology negative hematology ROS (+)   Anesthesia Other Findings   Reproductive/Obstetrics                             Anesthesia Physical Anesthesia Plan  ASA: II  Anesthesia Plan: General   Post-op Pain Management:    Induction: Intravenous  PONV Risk Score and Plan: 4 or greater and Ondansetron, Treatment may vary due to age or medical condition, Scopolamine patch - Pre-op, Midazolam and Dexamethasone  Airway Management Planned: Oral ETT  Additional Equipment:   Intra-op Plan:   Post-operative Plan: Extubation in OR  Informed Consent: I have reviewed the patients History and Physical, chart, labs and discussed the procedure including the risks, benefits and alternatives for the proposed anesthesia with the patient or authorized representative who has indicated his/her understanding and acceptance.     Dental advisory given  Plan Discussed with: CRNA and Surgeon  Anesthesia Plan Comments:         Anesthesia Quick Evaluation

## 2019-03-30 NOTE — Discharge Instructions (Signed)

## 2019-03-30 NOTE — H&P (Signed)
Brandon Watkins  Location: [facility] Surgery Patient #: [id number] DOB: 1961-10-27 Married / Language: English / Race: White Male   History of Present Illness  The patient is a 58 year old male who presents for a follow-up for Abdominal swelling. The patient is a 58 year old white male who we apparently saw about 4 years ago for an umbilical hernia. Since that time the bulge at the bellybutton has gotten a little bit larger and has been associated with some tenderness at times. He denies any nausea or vomiting. He denies any fevers or chills. His appetite is good and his bowels are working normally.   Past Surgical History  No pertinent past surgical history   Diagnostic Studies History  Colonoscopy  5-10 years ago  Allergies  No Known Drug Allergies Allergies Reconciled   Medication History  amLODIPine Besylate (5MG  Tablet, Oral) Active. Escitalopram Oxalate (10MG  Tablet, Oral) Active. Losartan Potassium-HCTZ (100-12.5MG  Tablet, Oral) Active. Lipitor (80MG  Tablet, Oral) Active. Zetia (10MG  Tablet, Oral) Active. Medications Reconciled  Social History  Alcohol use  Occasional alcohol use. Tobacco use  Never smoker.  Family History  Hypertension  Mother. Melanoma  Mother.  Other Problems  Hypercholesterolemia  Umbilical Hernia Repair     Review of Systems  General Not Present- Appetite Loss, Chills, Fatigue, Fever, Night Sweats, Weight Gain and Weight Loss. Note: All other systems negative (unless as noted in HPI & included Review of Systems) Skin Not Present- Change in Wart/Mole, Dryness, Hives, Jaundice, New Lesions, Non-Healing Wounds, Rash and Ulcer. HEENT Not Present- Earache, Hearing Loss, Hoarseness, Nose Bleed, Oral Ulcers, Ringing in the Ears, Seasonal Allergies, Sinus Pain, Sore Throat, Visual Disturbances, Wears glasses/contact lenses and Yellow Eyes. Respiratory Not Present- Bloody sputum, Chronic Cough, Difficulty Breathing,  Snoring and Wheezing. Breast Not Present- Breast Mass, Breast Pain, Nipple Discharge and Skin Changes. Cardiovascular Not Present- Chest Pain, Difficulty Breathing Lying Down, Leg Cramps, Palpitations, Rapid Heart Rate, Shortness of Breath and Swelling of Extremities. Gastrointestinal Not Present- Abdominal Pain, Bloating, Bloody Stool, Change in Bowel Habits, Chronic diarrhea, Constipation, Difficulty Swallowing, Excessive gas, Gets full quickly at meals, Hemorrhoids, Indigestion, Nausea, Rectal Pain and Vomiting. Male Genitourinary Not Present- Blood in Urine, Change in Urinary Stream, Frequency, Impotence, Nocturia, Painful Urination, Urgency and Urine Leakage. Musculoskeletal Not Present- Back Pain, Joint Pain, Joint Stiffness, Muscle Pain, Muscle Weakness and Swelling of Extremities. Neurological Not Present- Decreased Memory, Fainting, Headaches, Numbness, Seizures, Tingling, Tremor, Trouble walking and Weakness. Psychiatric Not Present- Anxiety, Bipolar, Change in Sleep Pattern, Depression, Fearful and Frequent crying. Endocrine Not Present- Cold Intolerance, Excessive Hunger, Hair Changes, Heat Intolerance and New Diabetes. Hematology Not Present- Easy Bruising, Excessive bleeding, Gland problems, HIV and Persistent Infections.  Vitals Weight: 246.25 lb Height: 69in Body Surface Area: 2.26 m Body Mass Index: 36.36 kg/m  Temp.: 98.69F(Oral)  Pulse: 85 (Regular)  BP: 142/90 (Sitting, Left Arm, Standard)       Physical Exam  General Mental Status-Alert. General Appearance-Consistent with stated age. Hydration-Well hydrated. Voice-Normal.  Head and Neck Head-normocephalic, atraumatic with no lesions or palpable masses. Trachea-midline. Thyroid Gland Characteristics - normal size and consistency.  Eye Eyeball - Bilateral-Extraocular movements intact. Sclera/Conjunctiva - Bilateral-No scleral icterus.  Chest and Lung Exam Chest and lung exam  reveals -quiet, even and easy respiratory effort with no use of accessory muscles and on auscultation, normal breath sounds, no adventitious sounds and normal vocal resonance. Inspection Chest Wall - Normal. Back - normal.  Cardiovascular Cardiovascular examination reveals -normal heart sounds, regular rate  and rhythm with no murmurs and normal pedal pulses bilaterally.  Abdomen Note: The abdomen is soft and nontender. There is a small reducible umbilical hernia. There is some mild tenderness associated with palpation of this.   Neurologic Neurologic evaluation reveals -alert and oriented x 3 with no impairment of recent or remote memory. Mental Status-Normal.  Musculoskeletal Normal Exam - Left-Upper Extremity Strength Normal and Lower Extremity Strength Normal. Normal Exam - Right-Upper Extremity Strength Normal and Lower Extremity Strength Normal.  Lymphatic Head & Neck  General Head & Neck Lymphatics: Bilateral - Description - Normal. Axillary  General Axillary Region: Bilateral - Description - Normal. Tenderness - Non Tender. Femoral & Inguinal  Generalized Femoral & Inguinal Lymphatics: Bilateral - Description - Normal. Tenderness - Non Tender.    Assessment & Plan  UMBILICAL HERNIA WITHOUT OBSTRUCTION AND WITHOUT GANGRENE (K42.9) Impression: The patient appears to have a small but symptomatic umbilical hernia. Because of the risk of incarceration and strangulation at think he would benefit from having this fixed. He would also like to have this done. I have discussed with him in detail the risks and benefits of the operation as well as some of the technical aspects including the use of mesh and he understands and wishes to proceed. I will plan for a umbilical hernia repair with mesh.

## 2019-03-30 NOTE — Transfer of Care (Signed)
Immediate Anesthesia Transfer of Care Note  Patient: Brandon Watkins  Procedure(s) Performed: UMBILICAL HERNIA REPAIR WITH MESH (N/A Abdomen)  Patient Location: PACU  Anesthesia Type:General  Level of Consciousness: awake, alert , oriented and patient cooperative  Airway & Oxygen Therapy: Patient Spontanous Breathing and Patient connected to nasal cannula oxygen  Post-op Assessment: Report given to RN and Post -op Vital signs reviewed and stable  Post vital signs: Reviewed and stable  Last Vitals:  Vitals Value Taken Time  BP    Temp    Pulse 73 03/30/2019 11:12 AM  Resp    SpO2 96 % 03/30/2019 11:12 AM    Last Pain:  Vitals:   03/30/19 0947  TempSrc: Oral  PainSc: 0-No pain      Patients Stated Pain Goal: 4 (43/27/61 4709)  Complications: No apparent anesthesia complications

## 2019-03-30 NOTE — Interval H&P Note (Signed)
History and Physical Interval Note:  03/30/2019 8:52 AM  Brandon Watkins  has presented today for surgery, with the diagnosis of Umbilical Hernia.  The various methods of treatment have been discussed with the patient and family. After consideration of risks, benefits and other options for treatment, the patient has consented to  Procedure(s): UMBILICAL HERNIA REPAIR WITH MESH (N/A) as a surgical intervention.  The patient's history has been reviewed, patient examined, no change in status, stable for surgery.  I have reviewed the patient's chart and labs.  Questions were answered to the patient's satisfaction.     Autumn Messing III

## 2019-03-30 NOTE — Anesthesia Postprocedure Evaluation (Signed)
Anesthesia Post Note  Patient: Brandon Watkins  Procedure(s) Performed: UMBILICAL HERNIA REPAIR WITH MESH (N/A Abdomen)     Patient location during evaluation: PACU Anesthesia Type: General Level of consciousness: awake and alert and oriented Pain management: pain level controlled Vital Signs Assessment: post-procedure vital signs reviewed and stable Respiratory status: spontaneous breathing, nonlabored ventilation and respiratory function stable Cardiovascular status: blood pressure returned to baseline and stable Postop Assessment: no apparent nausea or vomiting Anesthetic complications: no    Last Vitals:  Vitals:   03/30/19 1145 03/30/19 1210  BP: 103/89 117/79  Pulse: 76 77  Resp: 14 16  Temp:  36.9 C  SpO2: 92% 93%    Last Pain:  Vitals:   03/30/19 1210  TempSrc:   PainSc: 3                  Elajah Kunsman,Wirt A.

## 2019-03-30 NOTE — Anesthesia Procedure Notes (Signed)
Procedure Name: Intubation Date/Time: 03/30/2019 10:24 AM Performed by: Gwyndolyn Saxon, CRNA Pre-anesthesia Checklist: Patient identified, Emergency Drugs available, Suction available and Patient being monitored Patient Re-evaluated:Patient Re-evaluated prior to induction Oxygen Delivery Method: Circle System Utilized Preoxygenation: Pre-oxygenation with 100% oxygen Induction Type: IV induction Ventilation: Mask ventilation without difficulty Laryngoscope Size: Miller and 2 Grade View: Grade I Tube type: Oral Tube size: 7.0 mm Number of attempts: 1 Airway Equipment and Method: Stylet and Oral airway Placement Confirmation: ETT inserted through vocal cords under direct vision,  positive ETCO2 and breath sounds checked- equal and bilateral Secured at: 21 cm Tube secured with: Tape Dental Injury: Teeth and Oropharynx as per pre-operative assessment

## 2019-03-30 NOTE — Op Note (Signed)
03/30/2019  11:03 AM  PATIENT:  Brandon Watkins  58 y.o. male  PRE-OPERATIVE DIAGNOSIS:  Umbilical Hernia  POST-OPERATIVE DIAGNOSIS:  Umbilical Hernia  PROCEDURE:  Procedure(s): UMBILICAL HERNIA REPAIR WITH MESH (N/A)  SURGEON:  Surgeon(s) and Role:    * Jovita Kussmaul, MD - Primary  PHYSICIAN ASSISTANT:   ASSISTANTS: none   ANESTHESIA:   local and general  EBL:  minimal   BLOOD ADMINISTERED:none  DRAINS: none   LOCAL MEDICATIONS USED:  MARCAINE     SPECIMEN:  No Specimen  DISPOSITION OF SPECIMEN:  N/A  COUNTS:  YES  TOURNIQUET:  * No tourniquets in log *  DICTATION: .Dragon Dictation   After informed consent was obtained the patient was brought to the operating room and placed in the supine position on the operating table.  After adequate induction of general anesthesia the patient's abdomen was prepped with ChloraPrep, allowed to dry, and draped in usual sterile manner.  An appropriate timeout was performed.  The area around the umbilicus was infiltrated with quarter percent Marcaine.  A small transversely oriented incision was made at the inferior edge of the umbilicus with a 15 blade knife.  The incision was carried through the skin and subcutaneous tissue sharply with the electrocautery until the hernia defect was opened.  There was only some preperitoneal fat within the hernia defect.  The fat was reduced easily.  The subcutaneous tissue was dissected off the edges so that the edges were free.  A small free space was made by blunt finger dissection in the preperitoneal space.  The fascial edges were nice and healthy.  A small piece of Ventralight circular mesh was placed into the preperitoneal space below the fascia and held in place with the anchors.  The anchors were then trimmed at the fascial level.  The fascial defect was then closed with 3 interrupted #1 Novafil stitches incorporating the edge of the anchors.  Once this was accomplished the fascial defect was nicely  repaired and the mesh was in good position.  The subcutaneous tissue was irrigated with copious amounts of saline.  The subcutaneous tissue was then closed with interrupted 2-0 Vicryl stitches.  The skin was then closed with a running 4-0 Monocryl subcuticular stitch.  Dermabond dressings were applied.  The patient tolerated the procedure well.  At the end of the case all needle sponge and instrument counts were correct.  The patient was then awakened and taken to recovery in stable condition.  PLAN OF CARE: Discharge to home after PACU  PATIENT DISPOSITION:  PACU - hemodynamically stable.   Delay start of Pharmacological VTE agent (>24hrs) due to surgical blood loss or risk of bleeding: not applicable

## 2019-03-31 ENCOUNTER — Encounter (HOSPITAL_BASED_OUTPATIENT_CLINIC_OR_DEPARTMENT_OTHER): Payer: Self-pay | Admitting: General Surgery

## 2019-06-26 DIAGNOSIS — Z1159 Encounter for screening for other viral diseases: Secondary | ICD-10-CM | POA: Diagnosis not present

## 2019-06-26 DIAGNOSIS — Z125 Encounter for screening for malignant neoplasm of prostate: Secondary | ICD-10-CM | POA: Diagnosis not present

## 2019-06-26 DIAGNOSIS — I10 Essential (primary) hypertension: Secondary | ICD-10-CM | POA: Diagnosis not present

## 2019-06-26 DIAGNOSIS — E785 Hyperlipidemia, unspecified: Secondary | ICD-10-CM | POA: Diagnosis not present

## 2020-01-28 ENCOUNTER — Ambulatory Visit: Payer: BC Managed Care – PPO | Attending: Internal Medicine

## 2020-01-28 DIAGNOSIS — Z23 Encounter for immunization: Secondary | ICD-10-CM

## 2020-01-28 NOTE — Progress Notes (Signed)
   Covid-19 Vaccination Clinic  Name:  Brandon Watkins    MRN: [id number] DOB: 11-29-1960  01/28/2020  [name] was observed post Covid-19 immunization for 15 minutes without incident. He was provided with Vaccine Information Sheet and instruction to access the V-Safe system.   [name] was instructed to call 911 with any severe reactions post vaccine: Marland Kitchen Difficulty breathing  . Swelling of face and throat  . A fast heartbeat  . A bad rash all over body  . Dizziness and weakness   Immunizations Administered    Name Date Dose VIS Date Route   Pfizer COVID-19 Vaccine 01/28/2020  4:07 PM 0.3 mL 10/23/2019 Intramuscular   Manufacturer: [place]   Lot: [id number]   [place]: [id number]

## 2020-02-22 ENCOUNTER — Ambulatory Visit: Payer: BC Managed Care – PPO | Attending: Internal Medicine

## 2020-02-22 DIAGNOSIS — Z23 Encounter for immunization: Secondary | ICD-10-CM

## 2020-02-22 NOTE — Progress Notes (Signed)
   Covid-19 Vaccination Clinic  Name:  Brandon Watkins    MRN: [id number] DOB: [birth year]  02/22/2020  [name] was observed post Covid-19 immunization for 15 minutes without incident. He was provided with Vaccine Information Sheet and instruction to access the V-Safe system.   [name] was instructed to call 911 with any severe reactions post vaccine: Marland Kitchen Difficulty breathing  . Swelling of face and throat  . A fast heartbeat  . A bad rash all over body  . Dizziness and weakness   Immunizations Administered    Name Date Dose VIS Date Route   Pfizer COVID-19 Vaccine 02/22/2020  4:35 PM 0.3 mL 10/23/2019 Intramuscular   Manufacturer: [place]   Lot: [id number]   [place]: [id number]

## 2020-03-28 DIAGNOSIS — I10 Essential (primary) hypertension: Secondary | ICD-10-CM | POA: Diagnosis not present

## 2020-03-28 DIAGNOSIS — K51811 Other ulcerative colitis with rectal bleeding: Secondary | ICD-10-CM | POA: Diagnosis not present

## 2020-03-28 DIAGNOSIS — R739 Hyperglycemia, unspecified: Secondary | ICD-10-CM | POA: Diagnosis not present

## 2020-03-28 DIAGNOSIS — E785 Hyperlipidemia, unspecified: Secondary | ICD-10-CM | POA: Diagnosis not present

## 2020-03-28 DIAGNOSIS — E663 Overweight: Secondary | ICD-10-CM | POA: Diagnosis not present

## 2020-04-08 DIAGNOSIS — K51911 Ulcerative colitis, unspecified with rectal bleeding: Secondary | ICD-10-CM | POA: Diagnosis not present

## 2020-04-09 ENCOUNTER — Inpatient Hospital Stay (HOSPITAL_COMMUNITY)
Admission: EM | Admit: 2020-04-09 | Discharge: 2020-04-13 | DRG: 386 | Disposition: A | Discharge status: Home / Self Care | Payer: BC Managed Care – PPO | Attending: Internal Medicine | Admitting: Internal Medicine

## 2020-04-09 ENCOUNTER — Emergency Department (HOSPITAL_COMMUNITY): Payer: BC Managed Care – PPO

## 2020-04-09 ENCOUNTER — Other Ambulatory Visit: Payer: Self-pay

## 2020-04-09 ENCOUNTER — Encounter (HOSPITAL_COMMUNITY): Payer: Self-pay

## 2020-04-09 DIAGNOSIS — K429 Umbilical hernia without obstruction or gangrene: Secondary | ICD-10-CM | POA: Diagnosis not present

## 2020-04-09 DIAGNOSIS — E669 Obesity, unspecified: Secondary | ICD-10-CM | POA: Diagnosis present

## 2020-04-09 DIAGNOSIS — E871 Hypo-osmolality and hyponatremia: Secondary | ICD-10-CM | POA: Diagnosis present

## 2020-04-09 DIAGNOSIS — Z20822 Contact with and (suspected) exposure to covid-19: Secondary | ICD-10-CM | POA: Diagnosis present

## 2020-04-09 DIAGNOSIS — Z79899 Other long term (current) drug therapy: Secondary | ICD-10-CM | POA: Diagnosis not present

## 2020-04-09 DIAGNOSIS — Z6835 Body mass index (BMI) 35.0-35.9, adult: Secondary | ICD-10-CM

## 2020-04-09 DIAGNOSIS — E860 Dehydration: Secondary | ICD-10-CM | POA: Diagnosis present

## 2020-04-09 DIAGNOSIS — E876 Hypokalemia: Secondary | ICD-10-CM | POA: Diagnosis present

## 2020-04-09 DIAGNOSIS — R7989 Other specified abnormal findings of blood chemistry: Secondary | ICD-10-CM | POA: Diagnosis present

## 2020-04-09 DIAGNOSIS — E782 Mixed hyperlipidemia: Secondary | ICD-10-CM | POA: Diagnosis not present

## 2020-04-09 DIAGNOSIS — R634 Abnormal weight loss: Secondary | ICD-10-CM | POA: Diagnosis present

## 2020-04-09 DIAGNOSIS — N179 Acute kidney failure, unspecified: Secondary | ICD-10-CM | POA: Diagnosis present

## 2020-04-09 DIAGNOSIS — K51911 Ulcerative colitis, unspecified with rectal bleeding: Principal | ICD-10-CM | POA: Diagnosis present

## 2020-04-09 DIAGNOSIS — K529 Noninfective gastroenteritis and colitis, unspecified: Secondary | ICD-10-CM

## 2020-04-09 DIAGNOSIS — Z888 Allergy status to other drugs, medicaments and biological substances status: Secondary | ICD-10-CM | POA: Diagnosis not present

## 2020-04-09 DIAGNOSIS — Z833 Family history of diabetes mellitus: Secondary | ICD-10-CM | POA: Diagnosis not present

## 2020-04-09 DIAGNOSIS — Z7982 Long term (current) use of aspirin: Secondary | ICD-10-CM

## 2020-04-09 DIAGNOSIS — K5190 Ulcerative colitis, unspecified, without complications: Secondary | ICD-10-CM | POA: Diagnosis not present

## 2020-04-09 DIAGNOSIS — I10 Essential (primary) hypertension: Secondary | ICD-10-CM | POA: Diagnosis not present

## 2020-04-09 DIAGNOSIS — Z83438 Family history of other disorder of lipoprotein metabolism and other lipidemia: Secondary | ICD-10-CM | POA: Diagnosis not present

## 2020-04-09 DIAGNOSIS — Z9114 Patient's other noncompliance with medication regimen: Secondary | ICD-10-CM

## 2020-04-09 DIAGNOSIS — D473 Essential (hemorrhagic) thrombocythemia: Secondary | ICD-10-CM | POA: Diagnosis not present

## 2020-04-09 DIAGNOSIS — D75839 Thrombocytosis, unspecified: Secondary | ICD-10-CM

## 2020-04-09 DIAGNOSIS — F419 Anxiety disorder, unspecified: Secondary | ICD-10-CM | POA: Diagnosis not present

## 2020-04-09 DIAGNOSIS — Z8249 Family history of ischemic heart disease and other diseases of the circulatory system: Secondary | ICD-10-CM | POA: Diagnosis not present

## 2020-04-09 LAB — COMPREHENSIVE METABOLIC PANEL
ALT: 30 U/L (ref 0–44)
AST: 17 U/L (ref 15–41)
Albumin: 3.2 g/dL — ABNORMAL LOW (ref 3.5–5.0)
Alkaline Phosphatase: 60 U/L (ref 38–126)
Anion gap: 13 (ref 5–15)
BUN: 17 mg/dL (ref 6–20)
CO2: 28 mmol/L (ref 22–32)
Calcium: 8.2 mg/dL — ABNORMAL LOW (ref 8.9–10.3)
Chloride: 92 mmol/L — ABNORMAL LOW (ref 98–111)
Creatinine, Ser: 1.26 mg/dL — ABNORMAL HIGH (ref 0.61–1.24)
GFR calc Af Amer: >60 mL/min (ref >60)
GFR calc non Af Amer: >60 mL/min (ref >60)
Glucose, Bld: 109 mg/dL — ABNORMAL HIGH (ref 70–99)
Potassium: 3.4 mmol/L — ABNORMAL LOW (ref 3.5–5.1)
Sodium: 133 mmol/L — ABNORMAL LOW (ref 135–145)
Total Bilirubin: 0.8 mg/dL (ref 0.3–1.2)
Total Protein: 7 g/dL (ref 6.5–8.1)

## 2020-04-09 LAB — CBC
HCT: 43.3 % (ref 39.0–52.0)
Hemoglobin: 14.4 g/dL (ref 13.0–17.0)
MCH: 30.1 pg (ref 26.0–34.0)
MCHC: 33.3 g/dL (ref 30.0–36.0)
MCV: 90.6 fL (ref 80.0–100.0)
Platelets: 718 10*3/uL — ABNORMAL HIGH (ref 150–400)
RBC: 4.78 MIL/uL (ref 4.22–5.81)
RDW: 13.8 % (ref 11.5–15.5)
WBC: 12 10*3/uL — ABNORMAL HIGH (ref 4.0–10.5)
nRBC: 0 % (ref 0.0–0.2)

## 2020-04-09 LAB — TYPE AND SCREEN
ABO/RH(D): A POS
Antibody Screen: NEGATIVE

## 2020-04-09 MED ORDER — ACETAMINOPHEN 650 MG RE SUPP: 650.0000 mg | Freq: Four times a day (QID) | RECTAL | Status: DC | PRN

## 2020-04-09 MED ORDER — ACETAMINOPHEN 325 MG PO TABS: 650.0000 mg | ORAL_TABLET | Freq: Four times a day (QID) | ORAL | Status: DC | PRN

## 2020-04-09 MED ORDER — SODIUM CHLORIDE 0.9 % IV BOLUS
1000.0000 mL | Freq: Once | INTRAVENOUS | Status: AC
  Administered 2020-04-09: 1000 mL via INTRAVENOUS

## 2020-04-09 MED ORDER — MORPHINE SULFATE (PF) 4 MG/ML IV SOLN
4.0000 mg | Freq: Once | INTRAVENOUS | Status: AC
  Administered 2020-04-09: 4 mg via INTRAVENOUS
  Filled 2020-04-09: qty 1

## 2020-04-09 MED ORDER — IOHEXOL 9 MG/ML PO SOLN
ORAL | Status: AC
  Administered 2020-04-09: 900 mL via OROMUCOSAL
  Filled 2020-04-09: qty 1000

## 2020-04-09 MED ORDER — PANTOPRAZOLE SODIUM 40 MG IV SOLR
40.0000 mg | INTRAVENOUS | Status: DC
  Administered 2020-04-10: 40 mg via INTRAVENOUS
  Filled 2020-04-09: qty 40

## 2020-04-09 MED ORDER — HYDROCORTISONE NA SUCCINATE PF 100 MG IJ SOLR
100.0000 mg | Freq: Once | INTRAMUSCULAR | Status: AC
  Administered 2020-04-09: 100 mg via INTRAVENOUS
  Filled 2020-04-09: qty 2

## 2020-04-09 MED ORDER — HYDROCORTISONE NA SUCCINATE PF 100 MG IJ SOLR
100.0000 mg | Freq: Three times a day (TID) | INTRAMUSCULAR | Status: DC
  Administered 2020-04-10: 100 mg via INTRAVENOUS
  Filled 2020-04-09 (×2): qty 2

## 2020-04-09 MED ORDER — IOHEXOL 300 MG/ML  SOLN
100.0000 mL | Freq: Once | INTRAMUSCULAR | Status: AC | PRN
  Administered 2020-04-09: 100 mL via INTRAVENOUS

## 2020-04-09 MED ORDER — PROCHLORPERAZINE EDISYLATE 10 MG/2ML IJ SOLN: 5.0000 mg | INTRAMUSCULAR | Status: DC | PRN

## 2020-04-09 MED ORDER — POTASSIUM CHLORIDE IN NACL 20-0.9 MEQ/L-% IV SOLN
INTRAVENOUS | Status: DC
  Filled 2020-04-09 (×2): qty 1000

## 2020-04-09 MED ORDER — MORPHINE SULFATE (PF) 2 MG/ML IV SOLN
2.0000 mg | INTRAVENOUS | Status: DC | PRN
  Administered 2020-04-10 – 2020-04-11 (×3): 2 mg via INTRAVENOUS
  Filled 2020-04-09 (×3): qty 1

## 2020-04-09 MED ORDER — ENOXAPARIN SODIUM 40 MG/0.4ML ~~LOC~~ SOLN
40.0000 mg | SUBCUTANEOUS | Status: DC
  Filled 2020-04-09 (×2): qty 0.4

## 2020-04-09 NOTE — H&P (Signed)
History and Physical    [name] [id number] DOB: 11/27/1960 DOA: 04/09/2020  PCP: Vernie Shanks, MD   Patient coming from: Home.  I have personally briefly reviewed patient's old medical records in [place]  Chief Complaint: Abdominal pain and diarrhea.  HPI: Brandon Watkins is a 59 y.o. male with medical history significant of anxiety, essential hypertension, IBS, hyperlipidemia, obesity, umbilical hernia, ulcerative colitis who is coming to the emergency department due to 5 weeks of persistent diarrhea, decreased oral intake, 25 pound weight loss, fatigue, malaise, occasional chills and significant interference with his daily activities.  He mentions that he started taking again his leftover Entocort and mesalamine from last year already for several weeks, but this has not improved his symptoms.  He thinks the medications goes right through him.  He has had some mild red blood with stools.  He has felt nauseous at times, but denies vomiting, constipation or melena.  No dysuria, frequency or hematuria.  He denies fever, rhinorrhea, sore throat, dyspnea, wheezing or hemoptysis.  No chest pain, palpitations, dizziness, diaphoresis, PND, orthopnea or pitting edema of the lower extremities.  No polyuria, polydipsia, polyphagia or blurred vision.  ED Course: Initial vital signs temperature 98.8 F, pulse 80, respiration 18, blood pressure 115/60 mmHg and O2 sat 99% on room air.  The patient received a 1000 mL NS bolus, morphine 4 mg IVP and 100 mg of Solu-Cortef IVP.  CBC shows a white count of 12.0, hemoglobin 14.4 g/dL and platelets 718.  Sodium is 133, potassium 3.4, chloride 92, CO2 28 mmol/L.  Glucose 109, BUN 17, creatinine 1.26 and calcium 8.2 mg/dL.  His albumin is 3.2 g/dL, the rest of the hepatic functions are within expected range.  CT abdomen/pelvis with contrast show findings consistent with infectious or inflammatory pancolitis.  Please see images and full radiology  report for further detail.  Review of Systems: As per HPI otherwise all other systems reviewed and are negative.  Past Medical History:  Diagnosis Date  . Anxiety   . Essential hypertension   . IBS (irritable bowel syndrome)   . Mixed dyslipidemia   . Obesity   . Stress   . Umbilical hernia     Past Surgical History:  Procedure Laterality Date  . COLONOSCOPY    . REPAIR OF DIGITAL NERVE    . UMBILICAL HERNIA REPAIR N/A 03/30/2019   Procedure: UMBILICAL HERNIA REPAIR WITH MESH;  Surgeon: Jovita Kussmaul, MD;  Location: [place];  Service: General;  Laterality: N/A;    Social History  reports that he has never smoked. He has never used smokeless tobacco. He reports current alcohol use. He reports that he does not use drugs.  Allergies  Allergen Reactions  . Lipitor [Atorvastatin] Other (See Comments)    JOINT PAIN  . Prednisone Other (See Comments)    JOINT PAIN    Family History  Problem Relation Age of Onset  . Hypertension Father   . Hyperlipidemia Mother   . Hypertension Mother   . Diabetes Sister    Prior to Admission medications   Medication Sig Start Date End Date Taking? Authorizing Provider  amLODipine (NORVASC) 5 MG tablet Take 5 mg by mouth daily. 04/08/20  Yes [provider]  aspirin 81 MG chewable tablet Chew 81 mg by mouth daily.   Yes [provider]  atorvastatin (LIPITOR) 40 MG tablet Take 40 mg by mouth daily.   Yes [provider]  budesonide (ENTOCORT EC) 3  MG 24 hr capsule Take 3 mg by mouth daily.   Yes [provider]  diphenhydramine-acetaminophen (TYLENOL PM) 25-500 MG TABS tablet Take 1 tablet by mouth at bedtime as needed (sleep).   Yes [provider]  escitalopram (LEXAPRO) 10 MG tablet Take 10 mg by mouth daily.   Yes [provider]  losartan-hydrochlorothiazide (HYZAAR) 100-12.5 MG tablet Take 1 tablet by mouth daily. 03/15/20  Yes [provider]  mesalamine  (APRISO) 0.375 g 24 hr capsule Take 1.5 mg by mouth daily. Take 4 capsules (1.5 mg) daily   Yes [provider]  Multiple Vitamin (MULTIVITAMIN) tablet Take 1 tablet by mouth daily.   Yes [provider]  OVER THE COUNTER MEDICATION Take 1 Dose by mouth daily. ( GABA CALM)   Yes [provider]  HYDROcodone-acetaminophen (NORCO/VICODIN) 5-325 MG tablet Take 1-2 tablets by mouth every 6 (six) hours as needed for moderate pain or severe pain. Patient not taking: Reported on 04/09/2020 03/30/19   Autumn Messing III, MD  metoprolol succinate (TOPROL XL) 25 MG 24 hr tablet Take 1 tablet (25 mg total) by mouth daily. Patient not taking: Reported on 04/09/2020 12/16/15   Darlin Coco, MD   Physical Exam: Vitals:   04/09/20 1633 04/09/20 1919 04/09/20 2029  BP: 115/60 121/85 125/86  Pulse: 80 82 80  Resp: 18 18 18   Temp: 98.8 F (37.1 C)    TempSrc: Oral    SpO2: 99% 99% 99%  Height: 5\' 9"  (1.753 m)     Constitutional: NAD, calm, comfortable Eyes: PERRL, lids and conjunctivae normal ENMT: Mucous membranes and lips are dry. Posterior pharynx clear of any exudate or lesions. Neck: normal, supple, no masses, no thyromegaly Respiratory: clear to auscultation bilaterally, no wheezing, no crackles. Normal respiratory effort. No accessory muscle use.  Cardiovascular: Regular rate and rhythm, no murmurs / rubs / gallops. No extremity edema. 2+ pedal pulses. No carotid bruits.  Abdomen: Nondistended.  BS positive.  Soft, mild lower quadrant tenderness, no guarding or rebound, no masses palpated. No hepatosplenomegaly. Musculoskeletal: no clubbing / cyanosis. No joint deformity upper and lower extremities. Good ROM, no contractures. Normal muscle tone.  Skin: no rashes, lesions, ulcers on very limited dermatological examination. Neurologic: CN 2-12 grossly intact. Sensation intact, DTR normal. Strength 5/5 in all 4.  Psychiatric: Normal judgment and insight. Alert and oriented x 3.  Normal mood.   Labs on Admission: I have personally reviewed following labs and imaging studies  CBC: Recent Labs  Lab 04/09/20 1644  WBC 12.0*  HGB 14.4  HCT 43.3  MCV 90.6  PLT 718*    Basic Metabolic Panel: Recent Labs  Lab 04/09/20 1644  NA 133*  K 3.4*  CL 92*  CO2 28  GLUCOSE 109*  BUN 17  CREATININE 1.26*  CALCIUM 8.2*    GFR: CrCl cannot be calculated (Unknown ideal weight.).  Liver Function Tests: Recent Labs  Lab 04/09/20 1644  AST 17  ALT 30  ALKPHOS 60  BILITOT 0.8  PROT 7.0  ALBUMIN 3.2*    Urine analysis: No results found for: COLORURINE, APPEARANCEUR, LABSPEC, PHURINE, GLUCOSEU, HGBUR, BILIRUBINUR, KETONESUR, PROTEINUR, UROBILINOGEN, NITRITE, LEUKOCYTESUR  Radiological Exams on Admission: CT ABDOMEN PELVIS W CONTRAST  Result Date: 04/09/2020 CLINICAL DATA:  Ulcer colitis. EXAM: CT ABDOMEN AND PELVIS WITH CONTRAST TECHNIQUE: Multidetector CT imaging of the abdomen and pelvis was performed using the standard protocol following bolus administration of intravenous contrast. CONTRAST:  129mL OMNIPAQUE IOHEXOL 300 MG/ML  SOLN  COMPARISON:  None. FINDINGS: Lower chest: The lung bases are clear. The heart size is normal. Hepatobiliary: The liver is normal. Normal gallbladder.There is no biliary ductal dilation. Pancreas: Normal contours without ductal dilatation. No peripancreatic fluid collection. Spleen: Unremarkable. Adrenals/Urinary Tract: --Adrenal glands: Unremarkable. --Right kidney/ureter: No hydronephrosis or radiopaque kidney stones. --Left kidney/ureter: No hydronephrosis or radiopaque kidney stones. --Urinary bladder: Unremarkable. Stomach/Bowel: --Stomach/Duodenum: No hiatal hernia or other gastric abnormality. Normal duodenal course and caliber. --Small bowel: Unremarkable. --Colon: There is diffuse wall thickening the entire colon. Air-fluid levels are colon. --Appendix: Normal. Vascular/Lymphatic: Normal course and caliber of the major abdominal  vessels. --No retroperitoneal lymphadenopathy. --multiple enlarged likely reactive regional nodes --No pelvic or inguinal lymphadenopathy. Reproductive: Unremarkable Other: No ascites or free air. Bilateral fat containing inguinal hernias left than right Musculoskeletal. No acute displaced fractures. IMPRESSION: Findings are consistent with infectious or inflammatory pancolitis. Electronically Signed   By: Constance Holster M.D.   On: 04/09/2020 21:18    EKG: Independently reviewed.   Assessment/Plan Principal Problem:   Exacerbation of ulcerative colitis ([place]) Afebrile, nontoxic looking, already improving. Observation/MedSurg. Continue IV fluids per Continue hydrocortisone 100 mg every 8 hours. Protonix 40 mg IV every 24 hours. Analgesics as needed. Antiemetics as needed. GI to see in a.m. Ciprofloxacin/metronidazole in case of fever.  Active Problems:   Essential hypertension Currently low normal BP. Has not taken Hyzaar or metoprolol recently. Monitor blood pressure, renal function electrolytes. Resume metoprolol first if needed.    Mixed dyslipidemia Has not taken recently. Resume statin once clinically improved.    Hypokalemia Replacing through IV. Follow-up potassium level. Replace as needed.    Hyponatremia Secondary to GI losses. Continue NS infusion.    Thrombocytosis (HCC) Follow platelet count. On Lovenox for DVT prophylaxis.     DVT prophylaxis: Lovenox SQ. Code Status:   Full code. Family Communication: Disposition Plan:   Patient is from:  Home.  Anticipated DC to:  Home.  Anticipated DC date:  04/10/2020.  Anticipated DC barriers: Clinical improvement.  Consults called:  [facility] (Dr. Hope Budds) will see in the morning. Admission status:  Observation/MedSurg.   Severity of Illness: Medium severity.  Reubin Milan MD [facility]  How to contact the [facility] Attending or Consulting provider [place] or covering provider during after  hours [place], for this patient?   1. Check the care team in [facility] and look for a) attending/consulting [facility] provider listed and b) the [facility] team listed 2. Log into [email or web address] and use [place] universal password to access. If you do not have the password, please contact the hospital operator. 3. Locate the [facility] provider you are looking for under [facility] and page to a number that you can be directly reached. 4. If you still have difficulty reaching the provider, please page the [facility] (Director on Call) for the Hospitalists listed on amion for assistance.  04/09/2020, 10:37 PM   This document was prepared using Dragon voice recognition software and may contain some unintended transcription errors.

## 2020-04-09 NOTE — ED Notes (Signed)
Pt aware stool sample needed.

## 2020-04-09 NOTE — ED Provider Notes (Signed)
[facility] DEPT Provider Note   CSN: [id number] Arrival date & time: 04/09/20  1550     History Chief Complaint  Patient presents with  . Ulcerative Colitis    Brandon Watkins is a 59 y.o. male with history of IBS, anxiety, hypertension, dyslipidemia presents for evaluation of acute onset, progressively worsening abdominal pain with associated diarrhea and rectal bleeding for 5 weeks.  He reports that he has a history of ulcerative colitis that was previously managed by a gastroenterologist through [facility] but that he has not been seen in some time.  He states that for the last 5 weeks he has been experiencing an ulcerative colitis flare wherein he has sharp pain along the midline of the abdomen.  The pain worsens with eating.  He states that every time he tries to eat something he will experience diarrhea bright red blood in his bowel movements.  He denies urinary symptoms, fevers, chest pain, shortness of breath.  He has been taking mesalamine as well as 9 mg budesonide daily since his symptoms began without relief.  He states that he had a virtual visit with Brandon Watkins his gastroenterologist today who recommended presentation to the ED for IV steroids and fluids and further evaluation.   The history is provided by the patient.       Past Medical History:  Diagnosis Date  . Anxiety   . Essential hypertension   . IBS (irritable bowel syndrome)   . Mixed dyslipidemia   . Obesity   . Stress   . Umbilical hernia     Patient Active Problem List   Diagnosis Date Noted  . Chest pain 03/22/2015  . Heart murmur 03/22/2015  . Essential hypertension 03/22/2015  . Incomplete right bundle branch block 03/22/2015    Past Surgical History:  Procedure Laterality Date  . COLONOSCOPY    . REPAIR OF DIGITAL NERVE    . UMBILICAL HERNIA REPAIR N/A 03/30/2019   Procedure: UMBILICAL HERNIA REPAIR WITH MESH;  Surgeon: Jovita Kussmaul, MD;  Location:  [facility];  Service: General;  Laterality: N/A;       Family History  Problem Relation Age of Onset  . Hypertension Father   . Hyperlipidemia Mother   . Hypertension Mother   . Diabetes Sister     Social History   Tobacco Use  . Smoking status: Never Smoker  . Smokeless tobacco: Never Used  Substance Use Topics  . Alcohol use: Yes    Alcohol/week: 0.0 standard drinks    Comment: social  . Drug use: No    Home Medications Prior to Admission medications   Medication Sig Start Date End Date Taking? Authorizing Provider  amLODipine (NORVASC) 5 MG tablet Take 5 mg by mouth daily. 04/08/20  Yes [provider]  aspirin 81 MG chewable tablet Chew 81 mg by mouth daily.   Yes [provider]  atorvastatin (LIPITOR) 40 MG tablet Take 40 mg by mouth daily.   Yes [provider]  budesonide (ENTOCORT EC) 3 MG 24 hr capsule Take 3 mg by mouth daily.   Yes [provider]  diphenhydramine-acetaminophen (TYLENOL PM) 25-500 MG TABS tablet Take 1 tablet by mouth at bedtime as needed (sleep).   Yes [provider]  escitalopram (LEXAPRO) 10 MG tablet Take 10 mg by mouth daily.   Yes [provider]  losartan-hydrochlorothiazide (HYZAAR) 100-12.5 MG tablet Take 1 tablet by mouth daily. 03/15/20  Yes [provider]  mesalamine (APRISO) 0.375 g 24 hr capsule Take 1.5 mg by mouth daily. Take 4 capsules (1.5 mg) daily   Yes [provider]  Multiple Vitamin (MULTIVITAMIN) tablet Take 1 tablet by mouth daily.   Yes [provider]  OVER THE COUNTER MEDICATION Take 1 Dose by mouth daily. ( GABA CALM)   Yes [provider]  HYDROcodone-acetaminophen (NORCO/VICODIN) 5-325 MG tablet Take 1-2 tablets by mouth every 6 (six) hours as needed for moderate pain or severe pain. Patient not taking: Reported on 04/09/2020 03/30/19   Autumn Messing III, MD  metoprolol succinate (TOPROL XL) 25 MG 24 hr tablet Take 1  tablet (25 mg total) by mouth daily. Patient not taking: Reported on 04/09/2020 12/16/15   Darlin Coco, MD    Allergies    Lipitor [atorvastatin] and Prednisone  Review of Systems   Review of Systems  Constitutional: Negative for chills and fever.  Respiratory: Negative for shortness of breath.   Cardiovascular: Negative for chest pain.  Gastrointestinal: Positive for abdominal pain, blood in stool, diarrhea and nausea. Negative for vomiting.  Genitourinary: Negative for dysuria, frequency, hematuria and urgency.  All other systems reviewed and are negative.   Physical Exam Updated Vital Signs BP 125/86   Pulse 80   Temp 98.8 F (37.1 C) (Oral)   Resp 18   Ht 5\' 9"  (1.753 m)   SpO2 99%   BMI 35.32 kg/m   Physical Exam Vitals and nursing note reviewed.  Constitutional:      General: He is not in acute distress.    Appearance: He is well-developed.  HENT:     Head: Normocephalic and atraumatic.  Eyes:     General:        Right eye: No discharge.        Left eye: No discharge.     Conjunctiva/sclera: Conjunctivae normal.  Neck:     Vascular: No JVD.     Trachea: No tracheal deviation.  Cardiovascular:     Rate and Rhythm: Normal rate.  Pulmonary:     Effort: Pulmonary effort is normal.  Abdominal:     General: Abdomen is protuberant. Bowel sounds are increased. There is no distension.     Palpations: Abdomen is soft.     Tenderness: There is abdominal tenderness in the suprapubic area and left lower quadrant. There is no right CVA tenderness, left CVA tenderness, guarding or rebound.  Skin:    Findings: No erythema.  Neurological:     Mental Status: He is alert.  Psychiatric:        Behavior: Behavior normal.     ED Results / Procedures / Treatments   Labs (all labs ordered are listed, but only abnormal results are displayed) Labs Reviewed  COMPREHENSIVE METABOLIC PANEL - Abnormal; Notable for the following components:      Result Value   Sodium 133  (*)    Potassium 3.4 (*)    Chloride 92 (*)    Glucose, Bld 109 (*)    Creatinine, Ser 1.26 (*)    Calcium 8.2 (*)    Albumin 3.2 (*)    All other components within normal limits  CBC - Abnormal; Notable for the following components:   WBC 12.0 (*)    Platelets 718 (*)    All other components within normal limits  GASTROINTESTINAL PANEL BY PCR, STOOL (REPLACES STOOL CULTURE)  C DIFFICILE QUICK SCREEN W PCR REFLEX  POC OCCULT BLOOD, ED  TYPE AND SCREEN  ABO/RH  EKG None  Radiology CT ABDOMEN PELVIS W CONTRAST  Result Date: 04/09/2020 CLINICAL DATA:  Ulcer colitis. EXAM: CT ABDOMEN AND PELVIS WITH CONTRAST TECHNIQUE: Multidetector CT imaging of the abdomen and pelvis was performed using the standard protocol following bolus administration of intravenous contrast. CONTRAST:  163mL OMNIPAQUE IOHEXOL 300 MG/ML  SOLN COMPARISON:  None. FINDINGS: Lower chest: The lung bases are clear. The heart size is normal. Hepatobiliary: The liver is normal. Normal gallbladder.There is no biliary ductal dilation. Pancreas: Normal contours without ductal dilatation. No peripancreatic fluid collection. Spleen: Unremarkable. Adrenals/Urinary Tract: --Adrenal glands: Unremarkable. --Right kidney/ureter: No hydronephrosis or radiopaque kidney stones. --Left kidney/ureter: No hydronephrosis or radiopaque kidney stones. --Urinary bladder: Unremarkable. Stomach/Bowel: --Stomach/Duodenum: No hiatal hernia or other gastric abnormality. Normal duodenal course and caliber. --Small bowel: Unremarkable. --Colon: There is diffuse wall thickening the entire colon. Air-fluid levels are colon. --Appendix: Normal. Vascular/Lymphatic: Normal course and caliber of the major abdominal vessels. --No retroperitoneal lymphadenopathy. --multiple enlarged likely reactive regional nodes --No pelvic or inguinal lymphadenopathy. Reproductive: Unremarkable Other: No ascites or free air. Bilateral fat containing inguinal hernias left than  right Musculoskeletal. No acute displaced fractures. IMPRESSION: Findings are consistent with infectious or inflammatory pancolitis. Electronically Signed   By: Constance Holster M.D.   On: 04/09/2020 21:18    Procedures Procedures (including critical care time)  Medications Ordered in ED Medications  sodium chloride 0.9 % bolus 1,000 mL (1,000 mLs Intravenous Bolus from Bag 04/09/20 1910)  hydrocortisone sodium succinate (SOLU-CORTEF) 100 MG injection 100 mg (100 mg Intravenous Given 04/09/20 1911)  morphine 4 MG/ML injection 4 mg (4 mg Intravenous Given 04/09/20 1911)  iohexol (OMNIPAQUE) 9 MG/ML oral solution (900 mLs Mouth/Throat Contrast Given 04/09/20 2052)  iohexol (OMNIPAQUE) 300 MG/ML solution 100 mL (100 mLs Intravenous Contrast Given 04/09/20 2051)    ED Course  I have reviewed the triage vital signs and the nursing notes.  Pertinent labs & imaging results that were available during my care of the patient were reviewed by me and considered in my medical decision making (see chart for details).    MDM Rules/Calculators/A&P                      Patient presenting for evaluation of abdominal pains with associated diarrhea and bright red blood per rectum.  Reports history of ulcerative colitis.  He is afebrile, vital signs are stable.  He is uncomfortable but nontoxic in appearance.  No rebound or guarding noted on examination of the abdomen.  He was sent by his gastroenterologist Brandon Watkins for further evaluation and management, requesting IV fluids and IV steroids.  Lab work reviewed and interpreted by myself shows leukocytosis of 12,000, no anemia.  His creatinine is mildly elevated but BUN is within normal limits so he likely does not have an upper GI bleed.  He is mildly hypokalemic, hyponatremic and hypochloremic.  Anion gap is within normal limits.  Dr. Kathrynn Humble spoke with Dr. Alessandra Bevels with gastroenterology who is aware of the patient and recommended obtaining C. difficile screen  and gastrointestinal panel.  He agrees with obtaining CT scan of the abdomen and pelvis.  Patient was given IV fluids and Solu-Cortef in the ED as well as pain control.  9:00PM Signed out care to Dr. Kathrynn Humble to follow-up on results of CT scan and further GI recommendations.  Patient could potentially follow-up outpatient if results are reassuring but otherwise may require admission for further evaluation and management of ulcerative colitis flare.  Final  Clinical Impression(s) / ED Diagnoses Final diagnoses:  Inflammatory bowel diseases (IBD)    Rx / DC Orders ED Discharge Orders    None       Debroah Baller 04/09/20 2138    Varney Biles, MD 04/09/20 2225

## 2020-04-09 NOTE — ED Notes (Signed)
Pt unaware at this time to give a stool sample.

## 2020-04-09 NOTE — ED Notes (Signed)
Patient ambulated to the restroom without assistance 

## 2020-04-09 NOTE — ED Notes (Signed)
Patient transported to CT 

## 2020-04-09 NOTE — ED Triage Notes (Addendum)
Patient states he is having ulcerative colitis and has not been able to eat in ten days. Patient says pcp sent him to ED because he needs IV. Patient says pain is 10/10. Patient also states he is losing a lot of blood rectally. Patient reports general weakness also.  Patient requesting blood cholesterol be checked.

## 2020-04-10 ENCOUNTER — Other Ambulatory Visit: Payer: Self-pay

## 2020-04-10 ENCOUNTER — Encounter (HOSPITAL_COMMUNITY): Payer: Self-pay | Admitting: Internal Medicine

## 2020-04-10 DIAGNOSIS — Z9114 Patient's other noncompliance with medication regimen: Secondary | ICD-10-CM | POA: Diagnosis not present

## 2020-04-10 DIAGNOSIS — N179 Acute kidney failure, unspecified: Secondary | ICD-10-CM | POA: Diagnosis not present

## 2020-04-10 DIAGNOSIS — E860 Dehydration: Secondary | ICD-10-CM | POA: Diagnosis present

## 2020-04-10 DIAGNOSIS — R634 Abnormal weight loss: Secondary | ICD-10-CM | POA: Diagnosis present

## 2020-04-10 DIAGNOSIS — Z79899 Other long term (current) drug therapy: Secondary | ICD-10-CM | POA: Diagnosis not present

## 2020-04-10 DIAGNOSIS — K51911 Ulcerative colitis, unspecified with rectal bleeding: Secondary | ICD-10-CM | POA: Diagnosis not present

## 2020-04-10 DIAGNOSIS — I10 Essential (primary) hypertension: Secondary | ICD-10-CM | POA: Diagnosis present

## 2020-04-10 DIAGNOSIS — Z83438 Family history of other disorder of lipoprotein metabolism and other lipidemia: Secondary | ICD-10-CM | POA: Diagnosis not present

## 2020-04-10 DIAGNOSIS — Z20822 Contact with and (suspected) exposure to covid-19: Secondary | ICD-10-CM | POA: Diagnosis present

## 2020-04-10 DIAGNOSIS — Z6835 Body mass index (BMI) 35.0-35.9, adult: Secondary | ICD-10-CM | POA: Diagnosis not present

## 2020-04-10 DIAGNOSIS — E782 Mixed hyperlipidemia: Secondary | ICD-10-CM | POA: Diagnosis present

## 2020-04-10 DIAGNOSIS — K529 Noninfective gastroenteritis and colitis, unspecified: Secondary | ICD-10-CM | POA: Diagnosis present

## 2020-04-10 DIAGNOSIS — D473 Essential (hemorrhagic) thrombocythemia: Secondary | ICD-10-CM | POA: Diagnosis present

## 2020-04-10 DIAGNOSIS — Z888 Allergy status to other drugs, medicaments and biological substances status: Secondary | ICD-10-CM | POA: Diagnosis not present

## 2020-04-10 DIAGNOSIS — F419 Anxiety disorder, unspecified: Secondary | ICD-10-CM | POA: Diagnosis present

## 2020-04-10 DIAGNOSIS — E669 Obesity, unspecified: Secondary | ICD-10-CM | POA: Diagnosis present

## 2020-04-10 DIAGNOSIS — E876 Hypokalemia: Secondary | ICD-10-CM | POA: Diagnosis not present

## 2020-04-10 DIAGNOSIS — Z7982 Long term (current) use of aspirin: Secondary | ICD-10-CM | POA: Diagnosis not present

## 2020-04-10 DIAGNOSIS — Z8249 Family history of ischemic heart disease and other diseases of the circulatory system: Secondary | ICD-10-CM | POA: Diagnosis not present

## 2020-04-10 DIAGNOSIS — E871 Hypo-osmolality and hyponatremia: Secondary | ICD-10-CM | POA: Diagnosis present

## 2020-04-10 DIAGNOSIS — R7989 Other specified abnormal findings of blood chemistry: Secondary | ICD-10-CM | POA: Diagnosis present

## 2020-04-10 DIAGNOSIS — Z833 Family history of diabetes mellitus: Secondary | ICD-10-CM | POA: Diagnosis not present

## 2020-04-10 DIAGNOSIS — K429 Umbilical hernia without obstruction or gangrene: Secondary | ICD-10-CM | POA: Diagnosis present

## 2020-04-10 LAB — CBC WITH DIFFERENTIAL/PLATELET
Abs Immature Granulocytes: 0.04 10*3/uL (ref 0.00–0.07)
Basophils Absolute: 0.1 10*3/uL (ref 0.0–0.1)
Basophils Relative: 1 %
Eosinophils Absolute: 0.1 10*3/uL (ref 0.0–0.5)
Eosinophils Relative: 1 %
HCT: 36.2 % — ABNORMAL LOW (ref 39.0–52.0)
Hemoglobin: 12.1 g/dL — ABNORMAL LOW (ref 13.0–17.0)
Immature Granulocytes: 0 %
Lymphocytes Relative: 14 %
Lymphs Abs: 1.3 10*3/uL (ref 0.7–4.0)
MCH: 30 pg (ref 26.0–34.0)
MCHC: 33.4 g/dL (ref 30.0–36.0)
MCV: 89.8 fL (ref 80.0–100.0)
Monocytes Absolute: 0.8 10*3/uL (ref 0.1–1.0)
Monocytes Relative: 8 %
Neutro Abs: 7 10*3/uL (ref 1.7–7.7)
Neutrophils Relative %: 76 %
Platelets: 590 10*3/uL — ABNORMAL HIGH (ref 150–400)
RBC: 4.03 MIL/uL — ABNORMAL LOW (ref 4.22–5.81)
RDW: 13.7 % (ref 11.5–15.5)
WBC: 9.3 10*3/uL (ref 4.0–10.5)
nRBC: 0 % (ref 0.0–0.2)

## 2020-04-10 LAB — COMPREHENSIVE METABOLIC PANEL
ALT: 25 U/L (ref 0–44)
AST: 15 U/L (ref 15–41)
Albumin: 2.7 g/dL — ABNORMAL LOW (ref 3.5–5.0)
Alkaline Phosphatase: 51 U/L (ref 38–126)
Anion gap: 14 (ref 5–15)
BUN: 16 mg/dL (ref 6–20)
CO2: 21 mmol/L — ABNORMAL LOW (ref 22–32)
Calcium: 7.9 mg/dL — ABNORMAL LOW (ref 8.9–10.3)
Chloride: 103 mmol/L (ref 98–111)
Creatinine, Ser: 1.04 mg/dL (ref 0.61–1.24)
GFR calc Af Amer: >60 mL/min (ref >60)
GFR calc non Af Amer: >60 mL/min (ref >60)
Glucose, Bld: 115 mg/dL — ABNORMAL HIGH (ref 70–99)
Potassium: 3.1 mmol/L — ABNORMAL LOW (ref 3.5–5.1)
Sodium: 138 mmol/L (ref 135–145)
Total Bilirubin: 0.8 mg/dL (ref 0.3–1.2)
Total Protein: 6.1 g/dL — ABNORMAL LOW (ref 6.5–8.1)

## 2020-04-10 LAB — GASTROINTESTINAL PANEL BY PCR, STOOL (REPLACES STOOL CULTURE)

## 2020-04-10 LAB — HIV ANTIBODY (ROUTINE TESTING W REFLEX): HIV Screen 4th Generation wRfx: NONREACTIVE

## 2020-04-10 LAB — ABO/RH: ABO/RH(D): A POS

## 2020-04-10 LAB — C DIFFICILE QUICK SCREEN W PCR REFLEX
C Diff antigen: NEGATIVE
C Diff interpretation: NOT DETECTED
C Diff toxin: NEGATIVE

## 2020-04-10 LAB — SARS CORONAVIRUS 2 BY RT PCR (HOSPITAL ORDER, PERFORMED IN ~~LOC~~ HOSPITAL LAB): SARS Coronavirus 2: NEGATIVE

## 2020-04-10 MED ORDER — METHYLPREDNISOLONE SODIUM SUCC 125 MG IJ SOLR
60.0000 mg | Freq: Two times a day (BID) | INTRAMUSCULAR | Status: DC
  Administered 2020-04-10 – 2020-04-13 (×7): 60 mg via INTRAVENOUS
  Filled 2020-04-10 (×7): qty 2

## 2020-04-10 MED ORDER — POTASSIUM CHLORIDE 10 MEQ/100ML IV SOLN
10.0000 meq | INTRAVENOUS | Status: AC
  Administered 2020-04-10 (×4): 10 meq via INTRAVENOUS
  Filled 2020-04-10 (×2): qty 100

## 2020-04-10 MED ORDER — SODIUM CHLORIDE 0.9 % IV BOLUS
1000.0000 mL | Freq: Once | INTRAVENOUS | Status: AC
  Administered 2020-04-10: 1000 mL via INTRAVENOUS

## 2020-04-10 MED ORDER — SODIUM CHLORIDE 0.9 % IV SOLN: INTRAVENOUS | Status: DC

## 2020-04-10 MED ORDER — ESCITALOPRAM OXALATE 10 MG PO TABS
10.0000 mg | ORAL_TABLET | Freq: Every day | ORAL | Status: DC
  Administered 2020-04-10 – 2020-04-12 (×3): 10 mg via ORAL
  Filled 2020-04-10 (×3): qty 1

## 2020-04-10 MED ORDER — PANTOPRAZOLE SODIUM 40 MG PO TBEC
40.0000 mg | DELAYED_RELEASE_TABLET | Freq: Every day | ORAL | Status: DC
  Administered 2020-04-10 – 2020-04-12 (×3): 40 mg via ORAL
  Filled 2020-04-10 (×3): qty 1

## 2020-04-10 NOTE — Progress Notes (Signed)
PROGRESS NOTE  Brandon Watkins [id number] DOB: 1960-12-26 DOA: 04/09/2020 PCP: Vernie Shanks, MD  Brief History   58 year old man PMH ulcerative colitis presented with 5 weeks of persistent diarrhea, decreased oral intake, weight loss, recently restarted Entocort and mesalamine without effect.  Some red blood with stools.  Admitted for acute ulcerative colitis flare.  A & P  Exacerbation of ulcerative colitis with rectal bleeding and diarrhea.  CT showed pancolitis.  C. difficile negative. --Clinically improved.  Discussed with gastroenterology, will continue steroids IV, PPI.  Full liquid diet.  Hypokalemia --Replete. --Check magnesium and phosphorus in a.m.  Dehydration --Resolved with IV fluids.  Essential hypertension, not recently taking Hyzaar or metoprolol --Monitor clinically.  Currently normotensive.  Mixed dyslipidemia, not taking statin recently --Resume statin on discharge  Thrombocytosis --Likely secondary to acute illness.  Follow-up as an outpatient.  Disposition Plan:  Discussion: Seems to be improved, full liquid diet per GI today, continue steroids and PPI.  Reevaluate in a.m., if continues to improve, might be able to go home  Status is: Inpatient  Remains inpatient appropriate because:IV treatments appropriate due to intensity of illness or inability to take PO   Dispo: The patient is from: Home              Anticipated d/c is to: Home              Anticipated d/c date is: 1 day              Patient currently is not medically stable to d/c.  DVT prophylaxis: enoxaparin Code Status: Full Family Communication: none    Murray Hodgkins, MD  [facility] Direct contact: see [email or web address] (further directions at bottom of note if needed) 7PM-7AM contact night coverage as at bottom of note 04/10/2020, 3:00 PM  LOS: 0 days    Consults:  . Gastroenterology  Interval History/Subjective  Feels a lot better, less pain, would like to try some  liquids.  Objective   Vitals:  Vitals:   04/10/20 0936 04/10/20 1320  BP: 126/73 116/73  Pulse: 76 72  Resp: 16 18  Temp: 98.2 F (36.8 C) 98 F (36.7 C)  SpO2: 97% 95%    Exam:  Constitutional.  Appears calm and comfortable. Respiratory.  Clear to auscultation bilaterally.  No wheezes, rales or rhonchi.  Normal respiratory effort. Cardiovascular.  Regular rate and rhythm.  No murmur, rub or gallop. Abdomen.  Soft Psychiatric.  Grossly normal mood and affect.  Speech fluent and appropriate.  I have personally reviewed the following:   Today's Data  . Basic metabolic panel unremarkable other than potassium 3.1 . Hepatic function panel unremarkable . CBC notable for thrombocytosis, hemoglobin low at 12.1, probably hemodilution. . C. difficile screen negative  Scheduled Meds: . enoxaparin (LOVENOX) injection  40 mg Subcutaneous Q24H  . escitalopram  10 mg Oral Daily  . methylPREDNISolone (SOLU-MEDROL) injection  60 mg Intravenous Q12H  . pantoprazole  40 mg Oral QHS   Continuous Infusions: . sodium chloride 150 mL/hr at 04/10/20 1003    Principal Problem:   Exacerbation of ulcerative colitis ([place]) Active Problems:   Essential hypertension   Mixed dyslipidemia   Hypokalemia   Thrombocytosis (HCC)   LOS: 0 days   How to contact the [facility] Attending or Consulting provider [place] or covering provider during after hours [place], for this patient?  1. Check the care team in [facility] and look for a) attending/consulting [place] provider listed and b) the  [place] team listed 2. Log into [email or web address] and use [place]'s universal password to access. If you do not have the password, please contact the hospital operator. 3. Locate the [facility] provider you are looking for under [facility] and page to a number that you can be directly reached. 4. If you still have difficulty reaching the provider, please page the [facility] (Director on Call) for the Hospitalists listed on amion for  assistance.

## 2020-04-10 NOTE — Consult Note (Signed)
Referring Provider:  EDP Primary Care Physician:  Vernie Shanks, MD Primary Gastroenterologist:  Dr. Therisa Doyne  Reason for Consultation: Ulcerative colitis flare  HPI: Brandon Watkins is a 59 y.o. male with past medical history of ulcerative colitis was advised to come to the hospital for ulcerative colitis flare.  GI is consulted for further evaluation.  Chart from our office reviewed.  Looks like patient had a screening colonoscopy in 2013 which showed mild colitis in the ascending colon.  Somewhere around March 2020 patient started noticing rectal bleeding and underwent colonoscopy which showed mild to moderate inflammation in the rectum and transverse colon.  Biopsies were positive for colitis.  He was started on budesonide and Apriso at that time.  Patient took medication only for few weeks which resulted in improvement in symptoms.  Patient subsequently stopped taking his medication.  Around 4 to 5 weeks ago he started noticing GI symptoms again.  Described as multiple episodes of diarrhea with up to 10-15 bowel movements per day with rectal bleeding.  Associated with generalized abdominal pain.  Denies any nausea or vomiting but was complaining of poor oral intake, fatigue and weakness.  He started taking budesonide and mesalamine again few weeks ago without any improvement in symptoms.  Was seen in the office few days ago and was advised to come to the hospital for further management.  CT scan yesterday showed pancolitis.  He was started on IV steroids with significant improvement in symptoms  Past Medical History:  Diagnosis Date  . Anxiety   . Essential hypertension   . IBS (irritable bowel syndrome)   . Mixed dyslipidemia   . Obesity   . Stress   . Umbilical hernia     Past Surgical History:  Procedure Laterality Date  . COLONOSCOPY    . REPAIR OF DIGITAL NERVE    . UMBILICAL HERNIA REPAIR N/A 03/30/2019   Procedure: UMBILICAL HERNIA REPAIR WITH MESH;  Surgeon: Jovita Kussmaul,  MD;  Location: [facility];  Service: General;  Laterality: N/A;    Prior to Admission medications   Medication Sig Start Date End Date Taking? Authorizing Provider  amLODipine (NORVASC) 5 MG tablet Take 5 mg by mouth daily. 04/08/20  Yes [provider]  aspirin 81 MG chewable tablet Chew 81 mg by mouth daily.   Yes [provider]  atorvastatin (LIPITOR) 40 MG tablet Take 40 mg by mouth daily.   Yes [provider]  budesonide (ENTOCORT EC) 3 MG 24 hr capsule Take 3 mg by mouth daily.   Yes [provider]  diphenhydramine-acetaminophen (TYLENOL PM) 25-500 MG TABS tablet Take 1 tablet by mouth at bedtime as needed (sleep).   Yes [provider]  escitalopram (LEXAPRO) 10 MG tablet Take 10 mg by mouth daily.   Yes [provider]  losartan-hydrochlorothiazide (HYZAAR) 100-12.5 MG tablet Take 1 tablet by mouth daily. 03/15/20  Yes [provider]  mesalamine (APRISO) 0.375 g 24 hr capsule Take 1.5 mg by mouth daily. Take 4 capsules (1.5 mg) daily   Yes [provider]  Multiple Vitamin (MULTIVITAMIN) tablet Take 1 tablet by mouth daily.   Yes [provider]  OVER THE COUNTER MEDICATION Take 1 Dose by mouth daily. ( GABA CALM)   Yes [provider]  HYDROcodone-acetaminophen (NORCO/VICODIN) 5-325 MG tablet Take 1-2 tablets by mouth every 6 (six) hours as needed for moderate pain or severe pain. Patient not taking: Reported on 04/09/2020 03/30/19   Autumn Messing  III, MD  metoprolol succinate (TOPROL XL) 25 MG 24 hr tablet Take 1 tablet (25 mg total) by mouth daily. Patient not taking: Reported on 04/09/2020 12/16/15   Darlin Coco, MD    Scheduled Meds: . enoxaparin (LOVENOX) injection  40 mg Subcutaneous Q24H  . escitalopram  10 mg Oral Daily  . hydrocortisone sod succinate (SOLU-CORTEF) inj  100 mg Intravenous Q8H  . pantoprazole (PROTONIX) IV  40 mg Intravenous Q24H   Continuous  Infusions: . sodium chloride    . potassium chloride     PRN Meds:.acetaminophen **OR** acetaminophen, morphine injection, prochlorperazine  Allergies as of 04/09/2020 - Review Complete 04/09/2020  Allergen Reaction Noted  . Lipitor [atorvastatin] Other (See Comments) 03/18/2015  . Prednisone Other (See Comments) 03/18/2015    Family History  Problem Relation Age of Onset  . Hypertension Father   . Hyperlipidemia Mother   . Hypertension Mother   . Diabetes Sister     Social History   Socioeconomic History  . Marital status: Married    Spouse name: Not on file  . Number of children: Not on file  . Years of education: Not on file  . Highest education level: Not on file  Occupational History  . Not on file  Tobacco Use  . Smoking status: Never Smoker  . Smokeless tobacco: Never Used  Substance and Sexual Activity  . Alcohol use: Yes    Alcohol/week: 0.0 standard drinks    Comment: social  . Drug use: No  . Sexual activity: Not on file  Other Topics Concern  . Not on file  Social History Narrative  . Not on file   Social Determinants of Health   Financial Resource Strain:   . Difficulty of Paying Living Expenses:   Food Insecurity:   . Worried About Charity fundraiser in the Last Year:   . Arboriculturist in the Last Year:   Transportation Needs:   . Film/video editor (Medical):   Marland Kitchen Lack of Transportation (Non-Medical):   Physical Activity:   . Days of Exercise per Week:   . Minutes of Exercise per Session:   Stress:   . Feeling of Stress :   Social Connections:   . Frequency of Communication with Friends and Family:   . Frequency of Social Gatherings with Friends and Family:   . Attends Religious Services:   . Active Member of Clubs or Organizations:   . Attends Archivist Meetings:   Marland Kitchen Marital Status:   Intimate Partner Violence:   . Fear of Current or Ex-Partner:   . Emotionally Abused:   Marland Kitchen Physically Abused:   . Sexually Abused:      Review of Systems: Review of Systems  Constitutional: Positive for malaise/fatigue. Negative for chills and fever.  HENT: Negative for hearing loss and tinnitus.   Eyes: Negative for blurred vision and double vision.  Respiratory: Negative for cough and hemoptysis.   Cardiovascular: Negative for chest pain and palpitations.  Gastrointestinal: Positive for abdominal pain, blood in stool and diarrhea. Negative for constipation, heartburn, nausea and vomiting.  Genitourinary: Negative for dysuria and urgency.  Musculoskeletal: Negative for myalgias and neck pain.  Skin: Negative for itching and rash.  Neurological: Negative for seizures and loss of consciousness.  Endo/Heme/Allergies: Bruises/bleeds easily.  Psychiatric/Behavioral: Negative for hallucinations and substance abuse.    Physical Exam: Vital signs: Vitals:   04/10/20 0502 04/10/20 0936  BP: (!) 116/93 126/73  Pulse: 82 76  Resp: 16  16  Temp: 97.8 F (36.6 C) 98.2 F (36.8 C)  SpO2: 99% 97%   Last BM Date: 04/10/20 Physical Exam  Constitutional: He is oriented to person, place, and time. He appears well-developed and well-nourished.  HENT:  Head: Normocephalic and atraumatic.  Mouth/Throat: Oropharynx is clear and moist. No oropharyngeal exudate.  Eyes: EOM are normal. No scleral icterus.  Cardiovascular: Normal rate, regular rhythm and normal heart sounds.  Pulmonary/Chest: Effort normal and breath sounds normal. No respiratory distress.  Abdominal: Soft. Bowel sounds are normal. He exhibits no distension. There is no abdominal tenderness. There is no rebound and no guarding.  Musculoskeletal:        General: No edema. Normal range of motion.     Cervical back: Normal range of motion and neck supple.  Neurological: He is alert and oriented to person, place, and time.  Skin: Skin is warm. No rash noted. No erythema.  Psychiatric: He has a normal mood and affect. Judgment normal.  Vitals reviewed.   GI:   Lab Results: Recent Labs    04/09/20 1644 04/10/20 0525  WBC 12.0* 9.3  HGB 14.4 12.1*  HCT 43.3 36.2*  PLT 718* 590*   BMET Recent Labs    04/09/20 1644 04/10/20 0525  NA 133* 138  K 3.4* 3.1*  CL 92* 103  CO2 28 21*  GLUCOSE 109* 115*  BUN 17 16  CREATININE 1.26* 1.04  CALCIUM 8.2* 7.9*   LFT Recent Labs    04/10/20 0525  PROT 6.1*  ALBUMIN 2.7*  AST 15  ALT 25  ALKPHOS 51  BILITOT 0.8   PT/INR No results for input(s): LABPROT, INR in the last 72 hours.   Studies/Results: CT ABDOMEN PELVIS W CONTRAST  Result Date: 04/09/2020 CLINICAL DATA:  Ulcer colitis. EXAM: CT ABDOMEN AND PELVIS WITH CONTRAST TECHNIQUE: Multidetector CT imaging of the abdomen and pelvis was performed using the standard protocol following bolus administration of intravenous contrast. CONTRAST:  138mL OMNIPAQUE IOHEXOL 300 MG/ML  SOLN COMPARISON:  None. FINDINGS: Lower chest: The lung bases are clear. The heart size is normal. Hepatobiliary: The liver is normal. Normal gallbladder.There is no biliary ductal dilation. Pancreas: Normal contours without ductal dilatation. No peripancreatic fluid collection. Spleen: Unremarkable. Adrenals/Urinary Tract: --Adrenal glands: Unremarkable. --Right kidney/ureter: No hydronephrosis or radiopaque kidney stones. --Left kidney/ureter: No hydronephrosis or radiopaque kidney stones. --Urinary bladder: Unremarkable. Stomach/Bowel: --Stomach/Duodenum: No hiatal hernia or other gastric abnormality. Normal duodenal course and caliber. --Small bowel: Unremarkable. --Colon: There is diffuse wall thickening the entire colon. Air-fluid levels are colon. --Appendix: Normal. Vascular/Lymphatic: Normal course and caliber of the major abdominal vessels. --No retroperitoneal lymphadenopathy. --multiple enlarged likely reactive regional nodes --No pelvic or inguinal lymphadenopathy. Reproductive: Unremarkable Other: No ascites or free air. Bilateral fat containing inguinal hernias  left than right Musculoskeletal. No acute displaced fractures. IMPRESSION: Findings are consistent with infectious or inflammatory pancolitis. Electronically Signed   By: Constance Holster M.D.   On: 04/09/2020 21:18    Impression/Plan: -Ulcerative colitis flare.  Patient was not taking his maintenance medication.  CT scan showed pancolitis. -Acute kidney injury.  Improving -Diarrhea with rectal bleeding.  From ulcerative colitis flare.  C. difficile negative  Recommendations -------------------------- -Significant improvement in symptoms with IV steroids. -C. difficile negative.  GI pathogen panel pending -Continue IV Solu-Medrol 60 mg twice daily for now -Okay to start full liquid diet  -Long discussion with patient at bedside.  Chronic nature of disease requiring long-term maintenance medication discussed.  Based on  my conversation with patient today, it looks like he had watched some YouTube videos and was thinking that diet modification will result in improvement in his colitis.  Need for maintenance medication for ongoing maintenance/ remission discussed.  He was also taking NSAIDs and I have advised him not to take NSAIDs which may result in worsening of his ulcerative colitis.    LOS: 0 days   Otis Brace  MD, FACP 04/10/2020, 9:48 AM  Contact #  [phone]

## 2020-04-10 NOTE — Progress Notes (Signed)

## 2020-04-11 DIAGNOSIS — N179 Acute kidney failure, unspecified: Secondary | ICD-10-CM | POA: Diagnosis not present

## 2020-04-11 DIAGNOSIS — E876 Hypokalemia: Secondary | ICD-10-CM | POA: Diagnosis not present

## 2020-04-11 DIAGNOSIS — I10 Essential (primary) hypertension: Secondary | ICD-10-CM | POA: Diagnosis not present

## 2020-04-11 DIAGNOSIS — K51911 Ulcerative colitis, unspecified with rectal bleeding: Secondary | ICD-10-CM | POA: Diagnosis not present

## 2020-04-11 DIAGNOSIS — E782 Mixed hyperlipidemia: Secondary | ICD-10-CM

## 2020-04-11 LAB — CBC WITH DIFFERENTIAL/PLATELET
Abs Immature Granulocytes: 0.07 10*3/uL (ref 0.00–0.07)
Basophils Absolute: 0 10*3/uL (ref 0.0–0.1)
Basophils Relative: 0 %
Eosinophils Absolute: 0 10*3/uL (ref 0.0–0.5)
Eosinophils Relative: 0 %
HCT: 32.8 % — ABNORMAL LOW (ref 39.0–52.0)
Hemoglobin: 10.8 g/dL — ABNORMAL LOW (ref 13.0–17.0)
Immature Granulocytes: 1 %
Lymphocytes Relative: 10 %
Lymphs Abs: 0.7 10*3/uL (ref 0.7–4.0)
MCH: 30.1 pg (ref 26.0–34.0)
MCHC: 32.9 g/dL (ref 30.0–36.0)
MCV: 91.4 fL (ref 80.0–100.0)
Monocytes Absolute: 0.7 10*3/uL (ref 0.1–1.0)
Monocytes Relative: 10 %
Neutro Abs: 5.9 10*3/uL (ref 1.7–7.7)
Neutrophils Relative %: 79 %
Platelets: 481 10*3/uL — ABNORMAL HIGH (ref 150–400)
RBC: 3.59 MIL/uL — ABNORMAL LOW (ref 4.22–5.81)
RDW: 13.8 % (ref 11.5–15.5)
WBC: 7.4 10*3/uL (ref 4.0–10.5)
nRBC: 0 % (ref 0.0–0.2)

## 2020-04-11 LAB — BASIC METABOLIC PANEL
Anion gap: 6 (ref 5–15)
BUN: 14 mg/dL (ref 6–20)
CO2: 26 mmol/L (ref 22–32)
Calcium: 8 mg/dL — ABNORMAL LOW (ref 8.9–10.3)
Chloride: 109 mmol/L (ref 98–111)
Creatinine, Ser: 1.06 mg/dL (ref 0.61–1.24)
GFR calc Af Amer: >60 mL/min (ref >60)
GFR calc non Af Amer: >60 mL/min (ref >60)
Glucose, Bld: 160 mg/dL — ABNORMAL HIGH (ref 70–99)
Potassium: 3.5 mmol/L (ref 3.5–5.1)
Sodium: 141 mmol/L (ref 135–145)

## 2020-04-11 LAB — PHOSPHORUS: Phosphorus: 2.7 mg/dL (ref 2.5–4.6)

## 2020-04-11 LAB — MAGNESIUM: Magnesium: 2.2 mg/dL (ref 1.7–2.4)

## 2020-04-11 MED ORDER — MESALAMINE ER 250 MG PO CPCR
500.0000 mg | ORAL_CAPSULE | Freq: Three times a day (TID) | ORAL | Status: DC
  Administered 2020-04-11 (×2): 500 mg via ORAL
  Filled 2020-04-11 (×3): qty 2

## 2020-04-11 MED ORDER — MESALAMINE ER 0.375 G PO CP24
1.5000 g | ORAL_CAPSULE | Freq: Every day | ORAL | Status: DC
  Filled 2020-04-11: qty 4

## 2020-04-11 NOTE — Progress Notes (Signed)
PROGRESS NOTE  Brandon Watkins [id number] DOB: [birth year] DOA: 04/09/2020 PCP: Vernie Shanks, MD   LOS: 1 day   Brief narrative: As per HPI and previous provider, 59 year old man PMH ulcerative colitis presented with 5 weeks of persistent diarrhea, decreased oral intake, weight loss, recently restarted Entocort and mesalamine without effect.  Some red blood with stools.  Admitted for acute ulcerative colitis flare.  Assessment/Plan:  Principal Problem:   Exacerbation of ulcerative colitis ([facility]) Active Problems:   Essential hypertension   Mixed dyslipidemia   Hypokalemia   Thrombocytosis ([facility])   Acute exacerbation of ulcerative colitis with rectal bleeding and diarrhea.  CT showed pancolitis.  C. difficile negative still with significant symptoms.   On board and is currently on IV steroids for liquid diet and PPI.  Follow GI recommendations.  Hypokalemia --Replete. Check magnesium and phosphorus in a.m.  Dehydration --Resolved with IV fluids.  Essential hypertension, not recently taking Hyzaar or metoprolol --Monitor clinically.   Mixed dyslipidemia, not taking statin recently --Resume statin on discharge   Thrombocytosis --Likely secondary to acute illness.  Follow-up as an outpatient.  VTE Prophylaxis: Lovenox subcu  Code Status: Full code  Family Communication: Patient's spouse at bedside  Status is: Inpatient  Remains inpatient appropriate because:IV treatments appropriate due to intensity of illness or inability to take PO and Inpatient level of care appropriate due to severity of illness  Dispo: The patient is from: Home              Anticipated d/c is to: Home              Anticipated d/c date is: 2 days              Patient currently is not medically stable to d/c.    Consultants:  GI  Procedures:  None  Antibiotics:  . None  Anti-infectives (From admission, onward)   None     Subjective: Today, patient was seen and examined  at bedside.  Complains of multiple episodes of watery diarrhea with some blood 6-7 episodes.  Complains of mild abdominal discomfort.  Pain is worse especially on oral intake.  Nausea vomiting.  No urinary urgency frequency dysuria  Objective: Vitals:   04/10/20 2035 04/11/20 0623  BP: 97/63 118/80  Pulse: 77 72  Resp: 15 18  Temp: 98.4 F (36.9 C) 98.4 F (36.9 C)  SpO2: 94% 94%    Intake/Output Summary (Last 24 hours) at 04/11/2020 1147 Last data filed at 04/11/2020 0300 Gross per 24 hour  Intake 1620 ml  Output --  Net 1620 ml   Filed Weights   04/10/20 0049  Weight: 98.4 kg   Body mass index is 32.05 kg/m.   Physical Exam: GENERAL: Patient is alert awake and oriented. Not in obvious distress.  Obese HENT: No scleral pallor or icterus. Pupils equally reactive to light. Oral mucosa is moist NECK: is supple, no gross swelling noted. CHEST: Clear to auscultation. No crackles or wheezes.  Diminished breath sounds bilaterally. CVS: S1 and S2 heard, no murmur. Regular rate and rhythm.  ABDOMEN: Soft, tenderness over the lower abdomen.  Bowel sounds positive. EXTREMITIES: No edema. CNS: Cranial nerves are intact. No focal motor deficits. SKIN: warm and dry without rashes.  Data Review: I have personally reviewed the following laboratory data and studies,  CBC: Recent Labs  Lab 04/09/20 1644 04/10/20 0525 04/11/20 0457  WBC 12.0* 9.3 7.4  NEUTROABS  --  7.0 5.9  HGB 14.4 12.1* 10.8*  HCT 43.3 36.2* 32.8*  MCV 90.6 89.8 91.4  PLT 718* 590* 123XX123*   Basic Metabolic Panel: Recent Labs  Lab 04/09/20 1644 04/10/20 0525 04/11/20 0457  NA 133* 138 141  K 3.4* 3.1* 3.5  CL 92* 103 109  CO2 28 21* 26  GLUCOSE 109* 115* 160*  BUN 17 16 14   CREATININE 1.26* 1.04 1.06  CALCIUM 8.2* 7.9* 8.0*  MG  --   --  2.2  PHOS  --   --  2.7   Liver Function Tests: Recent Labs  Lab 04/09/20 1644 04/10/20 0525  AST 17 15  ALT 30 25  ALKPHOS 60 51  BILITOT 0.8 0.8  PROT  7.0 6.1*  ALBUMIN 3.2* 2.7*   No results for input(s): LIPASE, AMYLASE in the last 168 hours. No results for input(s): AMMONIA in the last 168 hours. Cardiac Enzymes: No results for input(s): CKTOTAL, CKMB, CKMBINDEX, TROPONINI in the last 168 hours. BNP (last 3 results) No results for input(s): BNP in the last 8760 hours.  ProBNP (last 3 results) No results for input(s): PROBNP in the last 8760 hours.  CBG: No results for input(s): GLUCAP in the last 168 hours. Recent Results (from the past 240 hour(s))  SARS Coronavirus 2 by RT PCR (hospital order, performed in [facility] hospital lab) Nasopharyngeal Nasopharyngeal Swab     Status: None   Collection Time: 04/09/20 11:40 PM   Specimen: Nasopharyngeal Swab  Result Value Ref Range Status   SARS Coronavirus 2 NEGATIVE NEGATIVE Final    Comment: (NOTE) SARS-CoV-2 target nucleic acids are NOT DETECTED. The SARS-CoV-2 RNA is generally detectable in upper and lower respiratory specimens during the acute phase of infection. The lowest concentration of SARS-CoV-2 viral copies this assay can detect is 250 copies / mL. A negative result does not preclude SARS-CoV-2 infection and should not be used as the sole basis for treatment or other patient management decisions.  A negative result may occur with improper specimen collection / handling, submission of specimen other than nasopharyngeal swab, presence of viral mutation(s) within the areas targeted by this assay, and inadequate number of viral copies (<250 copies / mL). A negative result must be combined with clinical observations, patient history, and epidemiological information. Fact Sheet for Patients:   StrictlyIdeas.no Fact Sheet for Healthcare Providers: BankingDealers.co.za This test is not yet approved or cleared  by the [place] FDA and has been authorized for detection and/or diagnosis of SARS-CoV-2 by FDA under an Emergency  Use Authorization (EUA).  This EUA will remain in effect (meaning this test can be used) for the duration of the COVID-19 declaration under Section 564(b)(1) of the Act, 21 U.S.C. section 360bbb-3(b)(1), unless the authorization is terminated or revoked sooner. Performed at [facility], [place] [address], [place], [place] [address]   Gastrointestinal Panel by PCR , Stool     Status: None   Collection Time: 04/10/20  6:00 AM   Specimen: STOOL  Result Value Ref Range Status   Campylobacter species NOT DETECTED NOT DETECTED Final   Plesimonas shigelloides NOT DETECTED NOT DETECTED Final   Salmonella species NOT DETECTED NOT DETECTED Final   Yersinia enterocolitica NOT DETECTED NOT DETECTED Final   Vibrio species NOT DETECTED NOT DETECTED Final   Vibrio cholerae NOT DETECTED NOT DETECTED Final   Enteroaggregative E coli (EAEC) NOT DETECTED NOT DETECTED Final   Enteropathogenic E coli (EPEC) NOT DETECTED NOT DETECTED Final   Enterotoxigenic E coli (ETEC) NOT DETECTED NOT DETECTED  Final   Shiga like toxin producing E coli (STEC) NOT DETECTED NOT DETECTED Final   Shigella/Enteroinvasive E coli (EIEC) NOT DETECTED NOT DETECTED Final   Cryptosporidium NOT DETECTED NOT DETECTED Final   Cyclospora cayetanensis NOT DETECTED NOT DETECTED Final   Entamoeba histolytica NOT DETECTED NOT DETECTED Final   Giardia lamblia NOT DETECTED NOT DETECTED Final   Adenovirus F40/41 NOT DETECTED NOT DETECTED Final   Astrovirus NOT DETECTED NOT DETECTED Final   Norovirus GI/GII NOT DETECTED NOT DETECTED Final   Rotavirus A NOT DETECTED NOT DETECTED Final   Sapovirus (I, II, IV, and V) NOT DETECTED NOT DETECTED Final    Comment: Performed at [facility], [address], [place], [place] [address]  C Difficile Quick Screen w PCR reflex     Status: None   Collection Time: 04/10/20  6:00 AM   Specimen: STOOL  Result Value Ref Range Status   C Diff antigen NEGATIVE NEGATIVE Final   C  Diff toxin NEGATIVE NEGATIVE Final   C Diff interpretation No C. difficile detected.  Final    Comment: Performed at [facility], [place] [address], [place], [place] [address]     Studies: CT ABDOMEN PELVIS W CONTRAST  Result Date: 04/09/2020 CLINICAL DATA:  Ulcer colitis. EXAM: CT ABDOMEN AND PELVIS WITH CONTRAST TECHNIQUE: Multidetector CT imaging of the abdomen and pelvis was performed using the standard protocol following bolus administration of intravenous contrast. CONTRAST:  149mL OMNIPAQUE IOHEXOL 300 MG/ML  SOLN COMPARISON:  None. FINDINGS: Lower chest: The lung bases are clear. The heart size is normal. Hepatobiliary: The liver is normal. Normal gallbladder.There is no biliary ductal dilation. Pancreas: Normal contours without ductal dilatation. No peripancreatic fluid collection. Spleen: Unremarkable. Adrenals/Urinary Tract: --Adrenal glands: Unremarkable. --Right kidney/ureter: No hydronephrosis or radiopaque kidney stones. --Left kidney/ureter: No hydronephrosis or radiopaque kidney stones. --Urinary bladder: Unremarkable. Stomach/Bowel: --Stomach/Duodenum: No hiatal hernia or other gastric abnormality. Normal duodenal course and caliber. --Small bowel: Unremarkable. --Colon: There is diffuse wall thickening the entire colon. Air-fluid levels are colon. --Appendix: Normal. Vascular/Lymphatic: Normal course and caliber of the major abdominal vessels. --No retroperitoneal lymphadenopathy. --multiple enlarged likely reactive regional nodes --No pelvic or inguinal lymphadenopathy. Reproductive: Unremarkable Other: No ascites or free air. Bilateral fat containing inguinal hernias left than right Musculoskeletal. No acute displaced fractures. IMPRESSION: Findings are consistent with infectious or inflammatory pancolitis. Electronically Signed   By: Constance Holster M.D.   On: 04/09/2020 21:18      Flora Lipps, MD  [facility] 04/11/2020

## 2020-04-11 NOTE — Progress Notes (Signed)
PER PATIENT REQUEST(please type notes for DR). Sat through Sun am - no food or liquid intake, BM 515 am, 920am BM, 1110 am BM.  IVF. After mtg on Sun am, liquids, 215 pm, broth, pudding, supp drink. 530pm, broth, pudding, popcicle, juice. , BM 555 pm, 1005 pm, 1220 am, 105 am. All watery diarrhea.  Painful & suspect it caused inflammation. Have not taken the blue pills (Mesalamine).

## 2020-04-11 NOTE — Progress Notes (Signed)
[facility] Gastroenterology Progress Note  Brandon Watkins 59 y.o. 27-Nov-1960  CC:   Exacerbation of ulcerative colitis   Subjective: Patient seen and examined at bedside.  Continues to have watery diarrhea with 7-8 bowel movement yesterday which is better compared to more than 15 bowel movements prior to admission.  Complaining of mild abdominal discomfort.  Denies nausea and vomiting.  ROS : Afebrile, negative for chest pain and shortness of breath   Objective: Vital signs in last 24 hours: Vitals:   04/10/20 2035 04/11/20 0623  BP: 97/63 118/80  Pulse: 77 72  Resp: 15 18  Temp: 98.4 F (36.9 C) 98.4 F (36.9 C)  SpO2: 94% 94%    Physical Exam:  General.  Well developed not in acute distress Abdomen is soft, nontender, nondistended, bowel sounds present.  No peritoneal signs Neuro.  Alert/oriented x3 Psych.  Mood and affect normal  Lab Results: Recent Labs    04/10/20 0525 04/11/20 0457  NA 138 141  K 3.1* 3.5  CL 103 109  CO2 21* 26  GLUCOSE 115* 160*  BUN 16 14  CREATININE 1.04 1.06  CALCIUM 7.9* 8.0*  MG  --  2.2  PHOS  --  2.7   Recent Labs    04/09/20 1644 04/10/20 0525  AST 17 15  ALT 30 25  ALKPHOS 60 51  BILITOT 0.8 0.8  PROT 7.0 6.1*  ALBUMIN 3.2* 2.7*   Recent Labs    04/10/20 0525 04/11/20 0457  WBC 9.3 7.4  NEUTROABS 7.0 5.9  HGB 12.1* 10.8*  HCT 36.2* 32.8*  MCV 89.8 91.4  PLT 590* 481*   No results for input(s): LABPROT, INR in the last 72 hours.    Assessment/Plan: -Ulcerative colitis flare.  Patient was not taking his maintenance medication.  CT scan showed pancolitis. -Acute kidney injury.  Improving -Diarrhea with rectal bleeding.  From ulcerative colitis flare.  C. difficile negative.  Recommendations --------------------------  -C. difficile negative.  GI pathogen panel negative -Continue IV Solu-Medrol 60 mg twice daily for now -Continue full liquid diet -Restart Apriso  -Long discussion with patient at bedside  again today.  Possible need for IV steroids for 48 to 72 hours discussed.  Long-term management again discussed. -Avoid NSAIDs -GI will follow   Otis Brace MD, FACP 04/11/2020, 9:56 AM  Contact #  [phone]

## 2020-04-12 DIAGNOSIS — I10 Essential (primary) hypertension: Secondary | ICD-10-CM | POA: Diagnosis not present

## 2020-04-12 DIAGNOSIS — E876 Hypokalemia: Secondary | ICD-10-CM | POA: Diagnosis not present

## 2020-04-12 DIAGNOSIS — E782 Mixed hyperlipidemia: Secondary | ICD-10-CM | POA: Diagnosis not present

## 2020-04-12 DIAGNOSIS — K51911 Ulcerative colitis, unspecified with rectal bleeding: Secondary | ICD-10-CM | POA: Diagnosis not present

## 2020-04-12 LAB — COMPREHENSIVE METABOLIC PANEL
ALT: 31 U/L (ref 0–44)
AST: 17 U/L (ref 15–41)
Albumin: 2.3 g/dL — ABNORMAL LOW (ref 3.5–5.0)
Alkaline Phosphatase: 41 U/L (ref 38–126)
Anion gap: 7 (ref 5–15)
BUN: 13 mg/dL (ref 6–20)
CO2: 28 mmol/L (ref 22–32)
Calcium: 8 mg/dL — ABNORMAL LOW (ref 8.9–10.3)
Chloride: 109 mmol/L (ref 98–111)
Creatinine, Ser: 0.95 mg/dL (ref 0.61–1.24)
GFR calc Af Amer: >60 mL/min (ref >60)
GFR calc non Af Amer: >60 mL/min (ref >60)
Glucose, Bld: 159 mg/dL — ABNORMAL HIGH (ref 70–99)
Potassium: 3.9 mmol/L (ref 3.5–5.1)
Sodium: 144 mmol/L (ref 135–145)
Total Bilirubin: 0.6 mg/dL (ref 0.3–1.2)
Total Protein: 5 g/dL — ABNORMAL LOW (ref 6.5–8.1)

## 2020-04-12 LAB — CBC WITH DIFFERENTIAL/PLATELET
Abs Immature Granulocytes: 0.06 10*3/uL (ref 0.00–0.07)
Basophils Absolute: 0 10*3/uL (ref 0.0–0.1)
Basophils Relative: 0 %
Eosinophils Absolute: 0 10*3/uL (ref 0.0–0.5)
Eosinophils Relative: 0 %
HCT: 32.2 % — ABNORMAL LOW (ref 39.0–52.0)
Hemoglobin: 10.6 g/dL — ABNORMAL LOW (ref 13.0–17.0)
Immature Granulocytes: 1 %
Lymphocytes Relative: 11 %
Lymphs Abs: 0.6 10*3/uL — ABNORMAL LOW (ref 0.7–4.0)
MCH: 30.1 pg (ref 26.0–34.0)
MCHC: 32.9 g/dL (ref 30.0–36.0)
MCV: 91.5 fL (ref 80.0–100.0)
Monocytes Absolute: 0.7 10*3/uL (ref 0.1–1.0)
Monocytes Relative: 12 %
Neutro Abs: 4.1 10*3/uL (ref 1.7–7.7)
Neutrophils Relative %: 76 %
Platelets: 462 10*3/uL — ABNORMAL HIGH (ref 150–400)
RBC: 3.52 MIL/uL — ABNORMAL LOW (ref 4.22–5.81)
RDW: 14.1 % (ref 11.5–15.5)
WBC: 5.5 10*3/uL (ref 4.0–10.5)
nRBC: 0 % (ref 0.0–0.2)

## 2020-04-12 LAB — SEDIMENTATION RATE: Sed Rate: 30 mm/hr — ABNORMAL HIGH (ref 0–16)

## 2020-04-12 LAB — MAGNESIUM: Magnesium: 2.1 mg/dL (ref 1.7–2.4)

## 2020-04-12 LAB — PHOSPHORUS: Phosphorus: 3.4 mg/dL (ref 2.5–4.6)

## 2020-04-12 LAB — HEPATITIS B SURFACE ANTIGEN: Hepatitis B Surface Ag: NONREACTIVE

## 2020-04-12 MED ORDER — MESALAMINE 1.2 G PO TBEC
4.8000 g | DELAYED_RELEASE_TABLET | Freq: Every day | ORAL | Status: DC
  Administered 2020-04-12 – 2020-04-13 (×2): 4.8 g via ORAL
  Filled 2020-04-12 (×2): qty 4

## 2020-04-12 NOTE — Progress Notes (Signed)
Subjective: Patient states that his bowel movements have improved from 18-20 a day to about 7-9 a day. He continues to have loose, watery stools, however there is marked improvement in fecal urgency. He continues to have rectal bleeding, described as bright red. He has mild lower abdominal pain.  Objective: Vital signs in last 24 hours: Temp:  [97.9 F (36.6 C)-98.2 F (36.8 C)] 97.9 F (36.6 C) (06/01 0622) Pulse Rate:  [64-72] 64 (06/01 0622) Resp:  [16-18] 16 (06/01 0622) BP: (113-143)/(74-81) 143/81 (06/01 0622) SpO2:  [93 %-95 %] 94 % (06/01 0622) Weight change:  Last BM Date: 04/11/20  PE: Lying comfortably on bed, mild pallor GENERAL: Not in distress ABDOMEN: Soft, nondistended, nontender, normoactive bowel sounds EXTREMITIES: No deformity  Lab Results: Results for orders placed or performed during the hospital encounter of 04/09/20 (from the past 48 hour(s))  CBC WITH DIFFERENTIAL     Status: Abnormal   Collection Time: 04/11/20  4:57 AM  Result Value Ref Range   WBC 7.4 4.0 - 10.5 K/uL   RBC 3.59 (L) 4.22 - 5.81 MIL/uL   Hemoglobin 10.8 (L) 13.0 - 17.0 g/dL   HCT 32.8 (L) 39.0 - 52.0 %   MCV 91.4 80.0 - 100.0 fL   MCH 30.1 26.0 - 34.0 pg   MCHC 32.9 30.0 - 36.0 g/dL   RDW 13.8 11.5 - 15.5 %   Platelets 481 (H) 150 - 400 K/uL   nRBC 0.0 0.0 - 0.2 %   Neutrophils Relative % 79 %   Neutro Abs 5.9 1.7 - 7.7 K/uL   Lymphocytes Relative 10 %   Lymphs Abs 0.7 0.7 - 4.0 K/uL   Monocytes Relative 10 %   Monocytes Absolute 0.7 0.1 - 1.0 K/uL   Eosinophils Relative 0 %   Eosinophils Absolute 0.0 0.0 - 0.5 K/uL   Basophils Relative 0 %   Basophils Absolute 0.0 0.0 - 0.1 K/uL   WBC Morphology TOXIC GRANULATION    Immature Granulocytes 1 %   Abs Immature Granulocytes 0.07 0.00 - 0.07 K/uL    Comment: Performed at [facility], [place] [address] [place], [place] [address]  Basic metabolic panel     Status: Abnormal   Collection Time: 04/11/20  4:57 AM   Result Value Ref Range   Sodium 141 135 - 145 mmol/L   Potassium 3.5 3.5 - 5.1 mmol/L   Chloride 109 98 - 111 mmol/L   CO2 26 22 - 32 mmol/L   Glucose, Bld 160 (H) 70 - 99 mg/dL    Comment: Glucose reference range applies only to samples taken after fasting for at least 8 hours.   BUN 14 6 - 20 mg/dL   Creatinine, Ser 1.06 0.61 - 1.24 mg/dL   Calcium 8.0 (L) 8.9 - 10.3 mg/dL   GFR calc non Af Amer >60 >60 mL/min   GFR calc Af Amer >60 >60 mL/min   Anion gap 6 5 - 15    Comment: Performed at [facility], [place] [address], [place], [place] [address]  Magnesium     Status: None   Collection Time: 04/11/20  4:57 AM  Result Value Ref Range   Magnesium 2.2 1.7 - 2.4 mg/dL    Comment: Performed at [facility], [place] [address] [place],  [address]  Phosphorus     Status: None   Collection Time: 04/11/20  4:57 AM  Result Value Ref Range   Phosphorus 2.7 2.5 - 4.6  mg/dL    Comment: Performed at [facility], [place] [address] [place], [place] [address]  CBC WITH DIFFERENTIAL     Status: Abnormal   Collection Time: 04/12/20  4:42 AM  Result Value Ref Range   WBC 5.5 4.0 - 10.5 K/uL   RBC 3.52 (L) 4.22 - 5.81 MIL/uL   Hemoglobin 10.6 (L) 13.0 - 17.0 g/dL   HCT 32.2 (L) 39.0 - 52.0 %   MCV 91.5 80.0 - 100.0 fL   MCH 30.1 26.0 - 34.0 pg   MCHC 32.9 30.0 - 36.0 g/dL   RDW 14.1 11.5 - 15.5 %   Platelets 462 (H) 150 - 400 K/uL   nRBC 0.0 0.0 - 0.2 %   Neutrophils Relative % 76 %   Neutro Abs 4.1 1.7 - 7.7 K/uL   Lymphocytes Relative 11 %   Lymphs Abs 0.6 (L) 0.7 - 4.0 K/uL   Monocytes Relative 12 %   Monocytes Absolute 0.7 0.1 - 1.0 K/uL   Eosinophils Relative 0 %   Eosinophils Absolute 0.0 0.0 - 0.5 K/uL   Basophils Relative 0 %   Basophils Absolute 0.0 0.0 - 0.1 K/uL   WBC Morphology TOXIC GRANULATION    Immature Granulocytes 1 %   Abs Immature Granulocytes 0.06 0.00 - 0.07 K/uL    Comment: Performed at [facility], [place] [address], [place], [place] [address]  Magnesium     Status: None   Collection Time: 04/12/20  4:42 AM  Result Value Ref Range   Magnesium 2.1 1.7 - 2.4 mg/dL    Comment: Performed at [facility], [place] [address] [place], [place] [address]  Phosphorus     Status: None   Collection Time: 04/12/20  4:42 AM  Result Value Ref Range   Phosphorus 3.4 2.5 - 4.6 mg/dL    Comment: Performed at [facility], [place] [address], [place], [place] [address]  Comprehensive metabolic panel     Status: Abnormal   Collection Time: 04/12/20  4:42 AM  Result Value Ref Range   Sodium 144 135 - 145 mmol/L   Potassium 3.9 3.5 - 5.1 mmol/L   Chloride 109 98 - 111 mmol/L   CO2 28 22 - 32 mmol/L   Glucose, Bld 159 (H) 70 - 99 mg/dL    Comment: Glucose reference range applies only to samples taken after fasting for at least 8 hours.   BUN 13 6 - 20 mg/dL   Creatinine, Ser 0.95 0.61 - 1.24 mg/dL   Calcium 8.0 (L) 8.9 - 10.3 mg/dL   Total Protein 5.0 (L) 6.5 - 8.1 g/dL   Albumin 2.3 (L) 3.5 - 5.0 g/dL   AST 17 15 - 41 U/L   ALT 31 0 - 44 U/L   Alkaline Phosphatase 41 38 - 126 U/L   Total Bilirubin 0.6 0.3 - 1.2 mg/dL   GFR calc non Af Amer >60 >60 mL/min   GFR calc Af Amer >60 >60 mL/min   Anion gap 7 5 - 15    Comment: Performed at [facility], [place] [address], [place],  [address]    Studies/Results: No results found.  Medications: I have reviewed the patient's current medications.  Assessment: Pancolitis from flare of ulcerative colitis Improving thrombocytosis-hopefully a marker of improving inflammation Normocytic anemia, hemoglobin 10.8, MCV 91.4 Normal electrolytes and renal function Low albumin of 2.7, low protein of 6.1  C. difficile negative, GI pathogen panel negative  Plan: I had a lengthy, 50-minute discussion with the patient, regarding his condition. Patient is aware that this is a chronic  condition and he will need to be on maintenance medication as an outpatient. We discussed about various treatment modalities available. We will send ESR and CRP today, along with HbsAg and Tb gold test(in case patient needs to be started on biologic therapy as an outpatient) and TPMT level (in case patient needs to be started on azathioprine). We will start patient on regular diet today. Continue IV Solu-Medrol 60 mg every 12 hours today. Hopefully, will be able to discharge him home tomorrow, with a tapering dose of prednisone(40 mg p.o. daily for a week then 30 mg p.o. daily for a week, then 20 mg p.o. daily for a week, subsequently 10 mg p.o. daily for a week and then stop) and mesalamine(will have to find out which will be covered by his insurance -apriso/lialda/mesalamine generic/asacol).  Patient appears to be improving clinically since admission.   Ronnette Juniper, MD 04/12/2020, 8:42 AM

## 2020-04-12 NOTE — Progress Notes (Signed)
Initial Nutrition Assessment  DOCUMENTATION CODES:   Obesity unspecified  INTERVENTION:   -Encouraged PO intake -Answered pt's diet related questions  NUTRITION DIAGNOSIS:   Increased nutrient needs related to chronic illness(ulcerative colitis) as evidenced by estimated needs.  GOAL:   Patient will meet greater than or equal to 90% of their needs  MONITOR:   PO intake, Supplement acceptance, Weight trends, Labs, I & O's  REASON FOR ASSESSMENT:   Malnutrition Screening Tool    ASSESSMENT:   59 year old man PMH ulcerative colitis presented with 5 weeks of persistent diarrhea, decreased oral intake, weight loss, recently restarted Entocort and mesalamine without effect.  Patient presented with some red blood with stools.  Admitted for acute ulcerative colitis flare.  Patient in room working on his laptop. Pt in good spirits. States he had his first solid food today (eggs) and so far has tolerated. Pt continues to have loose stools. States he hasn't had any solid food for 14 days now. Pt states he feels weaker and hopes to get back to walking and other activities he enjoys. Pt not sure what he can eat now to prevent another flare up. Stated that pt needs to identify foods that he can tolerate. Follow a soft diet and add in foods to see if he can tolerate them or not, keep a food log and log any symptoms after consumption. Encouraged adequate protein intakes and to look into plant-based protein shakes.   Pt reports UBW of 230 lbs. States his weight got down to 210 lbs. Per weight records, pt has lost 21 lbs since 03/30/19 (8% wt loss x 1 year, insignificant for time frame).   I/Os: +2.4L since admit   Labs reviewed. Medications reviewed.  NUTRITION - FOCUSED PHYSICAL EXAM:  No muscle or fat depletion noted.  Diet Order:   Diet Order            Diet regular Room service appropriate? Yes; Fluid consistency: Thin  Diet effective now              EDUCATION NEEDS:    Education needs have been addressed  Skin:  Skin Assessment: Reviewed RN Assessment  Last BM:  6/1 -type 7  Height:   Ht Readings from Last 1 Encounters:  04/10/20 5\' 9"  (1.753 m)    Weight:   Wt Readings from Last 1 Encounters:  04/10/20 98.4 kg    Ideal Body Weight:     BMI:  Body mass index is 32.05 kg/m.  Estimated Nutritional Needs:   Kcal:  2000-2200  Protein:  90-100g  Fluid:  2L/day  Clayton Bibles, MS, RD, LDN Inpatient Clinical Dietitian Contact information available via Amion

## 2020-04-12 NOTE — Progress Notes (Addendum)
PROGRESS NOTE  Brandon Watkins [id number] DOB: 03-Aug-1961 DOA: 04/09/2020 PCP: Vernie Shanks, MD   LOS: 2 days   Brief narrative: As per HPI and previous provider,  59 year old male with PMH ulcerative colitis presented with 5 weeks of persistent diarrhea, decreased oral intake, weight loss, recently restarted Entocort and mesalamine without effect.  Patient presented with some red blood with stools.  Admitted for acute ulcerative colitis flare.  Assessment/Plan:  Principal Problem:   Exacerbation of ulcerative colitis ([place]) Active Problems:   Essential hypertension   Mixed dyslipidemia   Hypokalemia   Thrombocytosis ([facility])   Acute exacerbation of ulcerative colitis with rectal bleeding and diarrhea.  CT of the abdomen showed pancolitis.  C. difficile negative.  Patient still with significant symptoms with frequent bowel movements..   GI on board and is currently on IV steroids, patient was on liquid diet and PPI.  Spoke with GI at bedside recommend at least 24 more hours of IV Solu-Medrol..  Patient will be advanced on solid consistency diet today.  Hypokalemia Replenished.  Dehydration --Resolved with IV fluids.  Essential hypertension, not recently taking Hyzaar or metoprolol --Monitor clinically.   Mixed dyslipidemia, not taking statin recently --Resume statin on discharge   Thrombocytosis --Likely secondary to acute illness.  Follow-up as an outpatient.  VTE Prophylaxis: Lovenox subcu  Code Status: Full code  Family Communication: None today.  Status is: Inpatient  Remains inpatient appropriate because:IV treatments appropriate due to intensity of illness or inability to take PO and Inpatient level of care appropriate due to severity of illness  Dispo: The patient is from: Home              Anticipated d/c is to: Home              Anticipated d/c date is: 1 to 2 days.  Follow GI recommendation.              Patient currently is not medically stable  to d/c.    Consultants:  GI  Procedures:  None  Antibiotics:  . None  Anti-infectives (From admission, onward)   None     Subjective: Today, patient was seen and examined at bedside.  Still complains of frequent bowel movements.  Objective: Vitals:   04/11/20 2240 04/12/20 0622  BP: 115/78 (!) 143/81  Pulse: 68 64  Resp: 17 16  Temp: 98.1 F (36.7 C) 97.9 F (36.6 C)  SpO2: 95% 94%    Intake/Output Summary (Last 24 hours) at 04/12/2020 1111 Last data filed at 04/11/2020 1803 Gross per 24 hour  Intake 480 ml  Output --  Net 480 ml   Filed Weights   04/10/20 0049  Weight: 98.4 kg   Body mass index is 32.05 kg/m.   Physical Exam: GENERAL: Patient is alert awake and oriented. Not in obvious distress.  Obese HENT: No scleral pallor or icterus. Pupils equally reactive to light. Oral mucosa is moist NECK: is supple, no gross swelling noted. CHEST: Clear to auscultation. No crackles or wheezes.  Diminished breath sounds bilaterally. CVS: S1 and S2 heard, no murmur. Regular rate and rhythm.  ABDOMEN: Soft, nonspecific tenderness over the lower abdomen.  Bowel sounds positive. EXTREMITIES: No edema. CNS: Cranial nerves are intact. No focal motor deficits. SKIN: warm and dry without rashes.  Data Review: I have personally reviewed the following laboratory data and studies,  CBC: Recent Labs  Lab 04/09/20 1644 04/10/20 0525 04/11/20 0457 04/12/20 0442  WBC 12.0* 9.3 7.4 5.5  NEUTROABS  --  7.0 5.9 4.1  HGB 14.4 12.1* 10.8* 10.6*  HCT 43.3 36.2* 32.8* 32.2*  MCV 90.6 89.8 91.4 91.5  PLT 718* 590* 481* [id number]   Basic Metabolic Panel: Recent Labs  Lab 04/09/20 1644 04/10/20 0525 04/11/20 0457 04/12/20 0442  NA 133* 138 141 144  K 3.4* 3.1* 3.5 3.9  CL 92* 103 109 109  CO2 28 21* 26 28  GLUCOSE 109* 115* 160* 159*  BUN 17 16 14 13   CREATININE 1.26* 1.04 1.06 0.95  CALCIUM 8.2* 7.9* 8.0* 8.0*  MG  --   --  2.2 2.1  PHOS  --   --  2.7 3.4   Liver  Function Tests: Recent Labs  Lab 04/09/20 1644 04/10/20 0525 04/12/20 0442  AST 17 15 17   ALT 30 25 31   ALKPHOS 60 51 41  BILITOT 0.8 0.8 0.6  PROT 7.0 6.1* 5.0*  ALBUMIN 3.2* 2.7* 2.3*   No results for input(s): LIPASE, AMYLASE in the last 168 hours. No results for input(s): AMMONIA in the last 168 hours. Cardiac Enzymes: No results for input(s): CKTOTAL, CKMB, CKMBINDEX, TROPONINI in the last 168 hours. BNP (last 3 results) No results for input(s): BNP in the last 8760 hours.  ProBNP (last 3 results) No results for input(s): PROBNP in the last 8760 hours.  CBG: No results for input(s): GLUCAP in the last 168 hours. Recent Results (from the past 240 hour(s))  SARS Coronavirus 2 by RT PCR (hospital order, performed in [facility] hospital lab) Nasopharyngeal Nasopharyngeal Swab     Status: None   Collection Time: 04/09/20 11:40 PM   Specimen: Nasopharyngeal Swab  Result Value Ref Range Status   SARS Coronavirus 2 NEGATIVE NEGATIVE Final    Comment: (NOTE) SARS-CoV-2 target nucleic acids are NOT DETECTED. The SARS-CoV-2 RNA is generally detectable in upper and lower respiratory specimens during the acute phase of infection. The lowest concentration of SARS-CoV-2 viral copies this assay can detect is 250 copies / mL. A negative result does not preclude SARS-CoV-2 infection and should not be used as the sole basis for treatment or other patient management decisions.  A negative result may occur with improper specimen collection / handling, submission of specimen other than nasopharyngeal swab, presence of viral mutation(s) within the areas targeted by this assay, and inadequate number of viral copies (<250 copies / mL). A negative result must be combined with clinical observations, patient history, and epidemiological information. Fact Sheet for Patients:   StrictlyIdeas.no Fact Sheet for Healthcare  Providers: BankingDealers.co.za This test is not yet approved or cleared  by the [place] FDA and has been authorized for detection and/or diagnosis of SARS-CoV-2 by FDA under an Emergency Use Authorization (EUA).  This EUA will remain in effect (meaning this test can be used) for the duration of the COVID-19 declaration under Section 564(b)(1) of the Act, 21 U.S.C. section 360bbb-3(b)(1), unless the authorization is terminated or revoked sooner. Performed at [facility], [place] [address] [place], [place] [address]   Gastrointestinal Panel by PCR , Stool     Status: None   Collection Time: 04/10/20  6:00 AM   Specimen: STOOL  Result Value Ref Range Status   Campylobacter species NOT DETECTED NOT DETECTED Final   Plesimonas shigelloides NOT DETECTED NOT DETECTED Final   Salmonella species NOT DETECTED NOT DETECTED Final   Yersinia enterocolitica NOT DETECTED NOT DETECTED Final   Vibrio species NOT DETECTED NOT DETECTED Final   Vibrio cholerae  NOT DETECTED NOT DETECTED Final   Enteroaggregative E coli (EAEC) NOT DETECTED NOT DETECTED Final   Enteropathogenic E coli (EPEC) NOT DETECTED NOT DETECTED Final   Enterotoxigenic E coli (ETEC) NOT DETECTED NOT DETECTED Final   Shiga like toxin producing E coli (STEC) NOT DETECTED NOT DETECTED Final   Shigella/Enteroinvasive E coli (EIEC) NOT DETECTED NOT DETECTED Final   Cryptosporidium NOT DETECTED NOT DETECTED Final   Cyclospora cayetanensis NOT DETECTED NOT DETECTED Final   Entamoeba histolytica NOT DETECTED NOT DETECTED Final   Giardia lamblia NOT DETECTED NOT DETECTED Final   Adenovirus F40/41 NOT DETECTED NOT DETECTED Final   Astrovirus NOT DETECTED NOT DETECTED Final   Norovirus GI/GII NOT DETECTED NOT DETECTED Final   Rotavirus A NOT DETECTED NOT DETECTED Final   Sapovirus (I, II, IV, and V) NOT DETECTED NOT DETECTED Final    Comment: Performed at [facility], [address] [place], [place] [address]  C Difficile Quick Screen w PCR reflex     Status: None   Collection Time: 04/10/20  6:00 AM   Specimen: STOOL  Result Value Ref Range Status   C Diff antigen NEGATIVE NEGATIVE Final   C Diff toxin NEGATIVE NEGATIVE Final   C Diff interpretation No C. difficile detected.  Final    Comment: Performed at [facility], [place] [address], [place], [place] [address]     Studies: No results found.    Flora Lipps, MD  [facility] 04/12/2020

## 2020-04-13 DIAGNOSIS — I10 Essential (primary) hypertension: Secondary | ICD-10-CM | POA: Diagnosis not present

## 2020-04-13 DIAGNOSIS — E876 Hypokalemia: Secondary | ICD-10-CM | POA: Diagnosis not present

## 2020-04-13 DIAGNOSIS — E782 Mixed hyperlipidemia: Secondary | ICD-10-CM | POA: Diagnosis not present

## 2020-04-13 DIAGNOSIS — K51911 Ulcerative colitis, unspecified with rectal bleeding: Secondary | ICD-10-CM | POA: Diagnosis not present

## 2020-04-13 LAB — CBC WITH DIFFERENTIAL/PLATELET
Abs Immature Granulocytes: 0.42 10*3/uL — ABNORMAL HIGH (ref 0.00–0.07)
Basophils Absolute: 0 10*3/uL (ref 0.0–0.1)
Basophils Relative: 1 %
Eosinophils Absolute: 0 10*3/uL (ref 0.0–0.5)
Eosinophils Relative: 0 %
HCT: 34.7 % — ABNORMAL LOW (ref 39.0–52.0)
Hemoglobin: 11.2 g/dL — ABNORMAL LOW (ref 13.0–17.0)
Immature Granulocytes: 5 %
Lymphocytes Relative: 12 %
Lymphs Abs: 0.9 10*3/uL (ref 0.7–4.0)
MCH: 29.9 pg (ref 26.0–34.0)
MCHC: 32.3 g/dL (ref 30.0–36.0)
MCV: 92.5 fL (ref 80.0–100.0)
Monocytes Absolute: 1.1 10*3/uL — ABNORMAL HIGH (ref 0.1–1.0)
Monocytes Relative: 15 %
Neutro Abs: 5.2 10*3/uL (ref 1.7–7.7)
Neutrophils Relative %: 67 %
Platelets: 451 10*3/uL — ABNORMAL HIGH (ref 150–400)
RBC: 3.75 MIL/uL — ABNORMAL LOW (ref 4.22–5.81)
RDW: 14.1 % (ref 11.5–15.5)
WBC: 7.7 10*3/uL (ref 4.0–10.5)
nRBC: 0.3 % — ABNORMAL HIGH (ref 0.0–0.2)

## 2020-04-13 LAB — BASIC METABOLIC PANEL
Anion gap: 11 (ref 5–15)
BUN: 13 mg/dL (ref 6–20)
CO2: 28 mmol/L (ref 22–32)
Calcium: 7.8 mg/dL — ABNORMAL LOW (ref 8.9–10.3)
Chloride: 103 mmol/L (ref 98–111)
Creatinine, Ser: 0.91 mg/dL (ref 0.61–1.24)
GFR calc Af Amer: >60 mL/min (ref >60)
GFR calc non Af Amer: >60 mL/min (ref >60)
Glucose, Bld: 145 mg/dL — ABNORMAL HIGH (ref 70–99)
Potassium: 3.7 mmol/L (ref 3.5–5.1)
Sodium: 142 mmol/L (ref 135–145)

## 2020-04-13 LAB — HIGH SENSITIVITY CRP: CRP, High Sensitivity: 40.92 mg/L — ABNORMAL HIGH (ref 0.00–3.00)

## 2020-04-13 MED ORDER — PANTOPRAZOLE SODIUM 40 MG PO TBEC: 40.0000 mg | DELAYED_RELEASE_TABLET | Freq: Every day | ORAL | Status: DC

## 2020-04-13 MED ORDER — MESALAMINE 800 MG PO TBEC: 1600.0000 mg | DELAYED_RELEASE_TABLET | Freq: Three times a day (TID) | ORAL | 0 refills | Status: DC

## 2020-04-13 MED ORDER — PREDNISONE 10 MG PO TABS: ORAL_TABLET | ORAL | 0 refills | Status: AC

## 2020-04-13 NOTE — Plan of Care (Signed)
Pt VS WNL this morning. No complaints this am.  Ready for d/c.  Problem: Education: Goal: Knowledge of General Education information will improve Description: Including pain rating scale, medication(s)/side effects and non-pharmacologic comfort measures Outcome: Progressing   Problem: Health Behavior/Discharge Planning: Goal: Ability to manage health-related needs will improve Outcome: Progressing   Problem: Clinical Measurements: Goal: Ability to maintain clinical measurements within normal limits will improve Outcome: Progressing Goal: Will remain free from infection Outcome: Progressing Goal: Diagnostic test results will improve Outcome: Progressing Goal: Respiratory complications will improve Outcome: Progressing Goal: Cardiovascular complication will be avoided Outcome: Progressing   Problem: Activity: Goal: Risk for activity intolerance will decrease Outcome: Progressing   Problem: Nutrition: Goal: Adequate nutrition will be maintained Outcome: Progressing   Problem: Coping: Goal: Level of anxiety will decrease Outcome: Progressing   Problem: Elimination: Goal: Will not experience complications related to bowel motility Outcome: Progressing Goal: Will not experience complications related to urinary retention Outcome: Progressing   Problem: Pain Managment: Goal: General experience of comfort will improve Outcome: Progressing   Problem: Safety: Goal: Ability to remain free from injury will improve Outcome: Progressing   Problem: Skin Integrity: Goal: Risk for impaired skin integrity will decrease Outcome: Progressing

## 2020-04-13 NOTE — Discharge Summary (Signed)
Physician Discharge Summary  Brandon Watkins [id number] DOB: 1961/10/07 DOA: 04/09/2020  PCP: Vernie Shanks, MD  Admit date: 04/09/2020 Discharge date: 04/13/2020  Admitted From: Home  Discharge disposition: Home   Recommendations for Outpatient Follow-Up:   . Follow up with your primary care provider as scheduled by you . Follow-up with Dr. Therisa Doyne GI in 10 days. . Check CBC, BMP, magnesium in the next visit   Discharge Diagnosis:   Principal Problem:   Exacerbation of ulcerative colitis ([place]) Active Problems:   Essential hypertension   Mixed dyslipidemia   Hypokalemia   Thrombocytosis ([place])    Discharge Condition: Improved.  Diet recommendation:  Regular.  Wound care: None.  Code status: Full.   History of Present Illness:   59 year old male with PMH ulcerative colitis presented with 5 weeks of persistent diarrhea, decreased oral intake, weight loss, recently restarted Entocort and mesalamine without effect.  Patient presented with some red blood with stools. Admitted for acute ulcerative colitis flare.  Hospital Course:   Following conditions were addressed during hospitalization as listed below,  Acute exacerbation of ulcerative colitis with rectal bleeding and diarrhea.Likely secondary to noncompliance to medication.  CT of the abdomen showed pancolitis. C. difficile negative.   Patient was seen by GI and was put on high-dose IV steroids with gradual improvement in his symptoms.  At this time patient has been assessed by GI and has been considered stable for disposition home with prednisone taper mesalamine.  Prescriptions have been prescribed by GI.    Has tolerated oral diet at this time.  He will have to follow-up with GI as outpatient in 10 days.  Hypokalemia Replenished.  Potassium prior to discharge was 3.7.  Volume depletion --Resolved with IV fluids.  Essential hypertension, resume Hyzaar or metoprolol  Mixed dyslipidemia, not taking  statin recently, resume Lipitor on discharge  Thrombocytosis --Likely secondary to acute illness.  Downtrending. Follow-up as an outpatient.  Disposition.  At this time, patient is stable for disposition home.  Will need follow-up with PCP and GI as outpatient.  Medical Consultants:    GI  Procedures:    None Subjective:   Today, patient was seen and examined at bedside.  Has been having some loose stools but he feels better than yesterday.  Discharge Exam:   Vitals:   04/12/20 2212 04/13/20 0613  BP: 125/80 120/70  Pulse: 68 60  Resp: 17 15  Temp: 98 F (36.7 C) 98.3 F (36.8 C)  SpO2: 96% 95%   Vitals:   04/12/20 0622 04/12/20 1449 04/12/20 2212 04/13/20 0613  BP: (!) 143/81 127/82 125/80 120/70  Pulse: 64 72 68 60  Resp: 16 20 17 15   Temp: 97.9 F (36.6 C) 98.1 F (36.7 C) 98 F (36.7 C) 98.3 F (36.8 C)  TempSrc: Oral Oral Oral Oral  SpO2: 94% 95% 96% 95%  Weight:      Height:       General: Alert awake, not in obvious distress HENT: pupils equally reacting to light,  No scleral pallor or icterus noted. Oral mucosa is moist.  Chest:  Clear breath sounds.  Diminished breath sounds bilaterally. No crackles or wheezes.  CVS: S1 &S2 heard. No murmur.  Regular rate and rhythm. Abdomen: Soft, nontender, nondistended.  Bowel sounds are heard.   Extremities: No cyanosis, clubbing or edema.  Peripheral pulses are palpable. Psych: Alert, awake and oriented, normal mood CNS:  No cranial nerve deficits.  Power equal in all extremities.   Skin: Warm  and dry.  No rashes noted.  The results of significant diagnostics from this hospitalization (including imaging, microbiology, ancillary and laboratory) are listed below for reference.     Diagnostic Studies:   CT ABDOMEN PELVIS W CONTRAST  Result Date: 04/09/2020 CLINICAL DATA:  Ulcer colitis. EXAM: CT ABDOMEN AND PELVIS WITH CONTRAST TECHNIQUE: Multidetector CT imaging of the abdomen and pelvis was performed  using the standard protocol following bolus administration of intravenous contrast. CONTRAST:  149mL OMNIPAQUE IOHEXOL 300 MG/ML  SOLN COMPARISON:  None. FINDINGS: Lower chest: The lung bases are clear. The heart size is normal. Hepatobiliary: The liver is normal. Normal gallbladder.There is no biliary ductal dilation. Pancreas: Normal contours without ductal dilatation. No peripancreatic fluid collection. Spleen: Unremarkable. Adrenals/Urinary Tract: --Adrenal glands: Unremarkable. --Right kidney/ureter: No hydronephrosis or radiopaque kidney stones. --Left kidney/ureter: No hydronephrosis or radiopaque kidney stones. --Urinary bladder: Unremarkable. Stomach/Bowel: --Stomach/Duodenum: No hiatal hernia or other gastric abnormality. Normal duodenal course and caliber. --Small bowel: Unremarkable. --Colon: There is diffuse wall thickening the entire colon. Air-fluid levels are colon. --Appendix: Normal. Vascular/Lymphatic: Normal course and caliber of the major abdominal vessels. --No retroperitoneal lymphadenopathy. --multiple enlarged likely reactive regional nodes --No pelvic or inguinal lymphadenopathy. Reproductive: Unremarkable Other: No ascites or free air. Bilateral fat containing inguinal hernias left than right Musculoskeletal. No acute displaced fractures. IMPRESSION: Findings are consistent with infectious or inflammatory pancolitis. Electronically Signed   By: Constance Holster M.D.   On: 04/09/2020 21:18     Labs:   Basic Metabolic Panel: Recent Labs  Lab 04/09/20 1644 04/09/20 1644 04/10/20 0525 04/10/20 0525 04/11/20 0457 04/11/20 0457 04/12/20 0442 04/13/20 0533  NA 133*  --  138  --  141  --  144 142  K 3.4*   < > 3.1*   < > 3.5   < > 3.9 3.7  CL 92*  --  103  --  109  --  109 103  CO2 28  --  21*  --  26  --  28 28  GLUCOSE 109*  --  115*  --  160*  --  159* 145*  BUN 17  --  16  --  14  --  13 13  CREATININE 1.26*  --  1.04  --  1.06  --  0.95 0.91  CALCIUM 8.2*  --  7.9*   --  8.0*  --  8.0* 7.8*  MG  --   --   --   --  2.2  --  2.1  --   PHOS  --   --   --   --  2.7  --  3.4  --    < > = values in this interval not displayed.   GFR Estimated Creatinine Clearance: 102.4 mL/min (by C-G formula based on SCr of 0.91 mg/dL). Liver Function Tests: Recent Labs  Lab 04/09/20 1644 04/10/20 0525 04/12/20 0442  AST 17 15 17   ALT 30 25 31   ALKPHOS 60 51 41  BILITOT 0.8 0.8 0.6  PROT 7.0 6.1* 5.0*  ALBUMIN 3.2* 2.7* 2.3*   No results for input(s): LIPASE, AMYLASE in the last 168 hours. No results for input(s): AMMONIA in the last 168 hours. Coagulation profile No results for input(s): INR, PROTIME in the last 168 hours.  CBC: Recent Labs  Lab 04/09/20 1644 04/10/20 0525 04/11/20 0457 04/12/20 0442 04/13/20 0533  WBC 12.0* 9.3 7.4 5.5 7.7  NEUTROABS  --  7.0 5.9 4.1 5.2  HGB 14.4 12.1* 10.8*  10.6* 11.2*  HCT 43.3 36.2* 32.8* 32.2* 34.7*  MCV 90.6 89.8 91.4 91.5 92.5  PLT 718* 590* 481* 462* 451*   Cardiac Enzymes: No results for input(s): CKTOTAL, CKMB, CKMBINDEX, TROPONINI in the last 168 hours. BNP: Invalid input(s): POCBNP CBG: No results for input(s): GLUCAP in the last 168 hours. D-Dimer No results for input(s): DDIMER in the last 72 hours. Hgb A1c No results for input(s): HGBA1C in the last 72 hours. Lipid Profile No results for input(s): CHOL, HDL, LDLCALC, TRIG, CHOLHDL, LDLDIRECT in the last 72 hours. Thyroid function studies No results for input(s): TSH, T4TOTAL, T3FREE, THYROIDAB in the last 72 hours.  Invalid input(s): FREET3 Anemia work up No results for input(s): VITAMINB12, FOLATE, FERRITIN, TIBC, IRON, RETICCTPCT in the last 72 hours. Microbiology Recent Results (from the past 240 hour(s))  SARS Coronavirus 2 by RT PCR (hospital order, performed in [facility] hospital lab) Nasopharyngeal Nasopharyngeal Swab     Status: None   Collection Time: 04/09/20 11:40 PM   Specimen: Nasopharyngeal Swab  Result Value Ref Range  Status   SARS Coronavirus 2 NEGATIVE NEGATIVE Final    Comment: (NOTE) SARS-CoV-2 target nucleic acids are NOT DETECTED. The SARS-CoV-2 RNA is generally detectable in upper and lower respiratory specimens during the acute phase of infection. The lowest concentration of SARS-CoV-2 viral copies this assay can detect is 250 copies / mL. A negative result does not preclude SARS-CoV-2 infection and should not be used as the sole basis for treatment or other patient management decisions.  A negative result may occur with improper specimen collection / handling, submission of specimen other than nasopharyngeal swab, presence of viral mutation(s) within the areas targeted by this assay, and inadequate number of viral copies (<250 copies / mL). A negative result must be combined with clinical observations, patient history, and epidemiological information. Fact Sheet for Patients:   StrictlyIdeas.no Fact Sheet for Healthcare Providers: BankingDealers.co.za This test is not yet approved or cleared  by the [place] FDA and has been authorized for detection and/or diagnosis of SARS-CoV-2 by FDA under an Emergency Use Authorization (EUA).  This EUA will remain in effect (meaning this test can be used) for the duration of the COVID-19 declaration under Section 564(b)(1) of the Act, 21 U.S.C. section 360bbb-3(b)(1), unless the authorization is terminated or revoked sooner. Performed at [facility], [place] [address], [place], [place] [address]   Gastrointestinal Panel by PCR , Stool     Status: None   Collection Time: 04/10/20  6:00 AM   Specimen: STOOL  Result Value Ref Range Status   Campylobacter species NOT DETECTED NOT DETECTED Final   Plesimonas shigelloides NOT DETECTED NOT DETECTED Final   Salmonella species NOT DETECTED NOT DETECTED Final   Yersinia enterocolitica NOT DETECTED NOT DETECTED Final   Vibrio species NOT  DETECTED NOT DETECTED Final   Vibrio cholerae NOT DETECTED NOT DETECTED Final   Enteroaggregative E coli (EAEC) NOT DETECTED NOT DETECTED Final   Enteropathogenic E coli (EPEC) NOT DETECTED NOT DETECTED Final   Enterotoxigenic E coli (ETEC) NOT DETECTED NOT DETECTED Final   Shiga like toxin producing E coli (STEC) NOT DETECTED NOT DETECTED Final   Shigella/Enteroinvasive E coli (EIEC) NOT DETECTED NOT DETECTED Final   Cryptosporidium NOT DETECTED NOT DETECTED Final   Cyclospora cayetanensis NOT DETECTED NOT DETECTED Final   Entamoeba histolytica NOT DETECTED NOT DETECTED Final   Giardia lamblia NOT DETECTED NOT DETECTED Final   Adenovirus F40/41 NOT DETECTED NOT DETECTED  Final   Astrovirus NOT DETECTED NOT DETECTED Final   Norovirus GI/GII NOT DETECTED NOT DETECTED Final   Rotavirus A NOT DETECTED NOT DETECTED Final   Sapovirus (I, II, IV, and V) NOT DETECTED NOT DETECTED Final    Comment: Performed at [facility], [place], [place] [address]  C Difficile Quick Screen w PCR reflex     Status: None   Collection Time: 04/10/20  6:00 AM   Specimen: STOOL  Result Value Ref Range Status   C Diff antigen NEGATIVE NEGATIVE Final   C Diff toxin NEGATIVE NEGATIVE Final   C Diff interpretation No C. difficile detected.  Final    Comment: Performed at [facility], [place] [address], [place], [place] [address]     Discharge Instructions:   Discharge Instructions    Diet general   Complete by: As directed    Discharge instructions   Complete by: As directed    Follow-up with Dr. Therisa Doyne GI as scheduled by the clinic.  Continue to take medications as prescribed by Dr. Therisa Doyne.   Increase activity slowly   Complete by: As directed      Allergies as of 04/13/2020      Reactions   Lipitor [atorvastatin] Other (See Comments)   JOINT PAIN   Prednisone Other (See Comments)   JOINT PAIN      Medication List    STOP taking these medications     budesonide 3 MG 24 hr capsule Commonly known as: ENTOCORT EC   mesalamine 0.375 g 24 hr capsule Commonly known as: APRISO Replaced by: Mesalamine 800 MG Tbec     TAKE these medications   amLODipine 5 MG tablet Commonly known as: NORVASC Take 5 mg by mouth daily.   aspirin 81 MG chewable tablet Chew 81 mg by mouth daily.   atorvastatin 40 MG tablet Commonly known as: LIPITOR Take 40 mg by mouth daily.   diphenhydramine-acetaminophen 25-500 MG Tabs tablet Commonly known as: TYLENOL PM Take 1 tablet by mouth at bedtime as needed (sleep).   escitalopram 10 MG tablet Commonly known as: LEXAPRO Take 10 mg by mouth daily.   HYDROcodone-acetaminophen 5-325 MG tablet Commonly known as: NORCO/VICODIN Take 1-2 tablets by mouth every 6 (six) hours as needed for moderate pain or severe pain.   losartan-hydrochlorothiazide 100-12.5 MG tablet Commonly known as: HYZAAR Take 1 tablet by mouth daily.   Mesalamine 800 MG Tbec Commonly known as: Asacol HD Take 2 tablets (1,600 mg total) by mouth in the morning, at noon, and at bedtime. Replaces: mesalamine 0.375 g 24 hr capsule   metoprolol succinate 25 MG 24 hr tablet Commonly known as: Toprol XL Take 1 tablet (25 mg total) by mouth daily.   multivitamin tablet Take 1 tablet by mouth daily.   OVER THE COUNTER MEDICATION Take 1 Dose by mouth daily. ( GABA CALM)   pantoprazole 40 MG tablet Commonly known as: PROTONIX Take 1 tablet (40 mg total) by mouth at bedtime.   predniSONE 10 MG tablet Commonly known as: DELTASONE Take 4 tablets (40 mg total) by mouth daily for 7 days, THEN 2 tablets (20 mg total) daily for 7 days, THEN 1 tablet (10 mg total) daily for 7 days. Start taking on: April 13, 2020        Time coordinating discharge: 39 minutes  Signed:  Josephine Wooldridge  [facility] 04/13/2020, 9:25 AM

## 2020-04-13 NOTE — Progress Notes (Signed)
Subjective: Patient reports improvement in rectal bleeding, although still present it is in small amount and less frequent. He continues to have about 7 bowel movements in 24 hours, however the amount of diarrhea has improved. He currently denies abdominal pain. He was able to eat solids for lunch and dinner yesterday and is ready to be discharged home.  Objective: Vital signs in last 24 hours: Temp:  [98 F (36.7 C)-98.3 F (36.8 C)] 98.3 F (36.8 C) (06/02 0102) Pulse Rate:  [60-72] 60 (06/02 0613) Resp:  [15-20] 15 (06/02 7253) BP: (120-127)/(70-82) 120/70 (06/02 0613) SpO2:  [95 %-96 %] 95 % (06/02 6644) Weight change:  Last BM Date: 04/11/20  PE: Sitting up on bed, appears comfortable GENERAL: Mild pallor, no icterus ABDOMEN: Soft, nondistended, nontender, normoactive bowel sounds  EXTREMITIES: No deformity  Lab Results: Results for orders placed or performed during the hospital encounter of 04/09/20 (from the past 48 hour(s))  CBC WITH DIFFERENTIAL     Status: Abnormal   Collection Time: 04/12/20  4:42 AM  Result Value Ref Range   WBC 5.5 4.0 - 10.5 K/uL   RBC 3.52 (L) 4.22 - 5.81 MIL/uL   Hemoglobin 10.6 (L) 13.0 - 17.0 g/dL   HCT 32.2 (L) 39.0 - 52.0 %   MCV 91.5 80.0 - 100.0 fL   MCH 30.1 26.0 - 34.0 pg   MCHC 32.9 30.0 - 36.0 g/dL   RDW 14.1 11.5 - 15.5 %   Platelets 462 (H) 150 - 400 K/uL   nRBC 0.0 0.0 - 0.2 %   Neutrophils Relative % 76 %   Neutro Abs 4.1 1.7 - 7.7 K/uL   Lymphocytes Relative 11 %   Lymphs Abs 0.6 (L) 0.7 - 4.0 K/uL   Monocytes Relative 12 %   Monocytes Absolute 0.7 0.1 - 1.0 K/uL   Eosinophils Relative 0 %   Eosinophils Absolute 0.0 0.0 - 0.5 K/uL   Basophils Relative 0 %   Basophils Absolute 0.0 0.0 - 0.1 K/uL   WBC Morphology TOXIC GRANULATION    Immature Granulocytes 1 %   Abs Immature Granulocytes 0.06 0.00 - 0.07 K/uL    Comment: Performed at [facility], [place] [address] [place], [place] [address]  Magnesium      Status: None   Collection Time: 04/12/20  4:42 AM  Result Value Ref Range   Magnesium 2.1 1.7 - 2.4 mg/dL    Comment: Performed at [facility], [place] [address], [place], [place] [address]  Phosphorus     Status: None   Collection Time: 04/12/20  4:42 AM  Result Value Ref Range   Phosphorus 3.4 2.5 - 4.6 mg/dL    Comment: Performed at [facility], [place] [address], [place], [place] [address]  Comprehensive metabolic panel     Status: Abnormal   Collection Time: 04/12/20  4:42 AM  Result Value Ref Range   Sodium 144 135 - 145 mmol/L   Potassium 3.9 3.5 - 5.1 mmol/L   Chloride 109 98 - 111 mmol/L   CO2 28 22 - 32 mmol/L   Glucose, Bld 159 (H) 70 - 99 mg/dL    Comment: Glucose reference range applies only to samples taken after fasting for at least 8 hours.   BUN 13 6 - 20 mg/dL   Creatinine, Ser 0.95 0.61 - 1.24 mg/dL   Calcium 8.0 (L) 8.9 - 10.3 mg/dL   Total Protein 5.0 (L) 6.5 - 8.1 g/dL   Albumin  2.3 (L) 3.5 - 5.0 g/dL   AST 17 15 - 41 U/L   ALT 31 0 - 44 U/L   Alkaline Phosphatase 41 38 - 126 U/L   Total Bilirubin 0.6 0.3 - 1.2 mg/dL   GFR calc non Af Amer >60 >60 mL/min   GFR calc Af Amer >60 >60 mL/min   Anion gap 7 5 - 15    Comment: Performed at [facility], [place] [address], [place], [place] [address]  Sedimentation rate     Status: Abnormal   Collection Time: 04/12/20  9:05 AM  Result Value Ref Range   Sed Rate 30 (H) 0 - 16 mm/hr    Comment: Performed at [facility] [place] [address] [place], [place] [address]  High sensitivity CRP     Status: Abnormal   Collection Time: 04/12/20  9:05 AM  Result Value Ref Range   CRP, High Sensitivity 40.92 (H) 0.00 - 3.00 mg/L    Comment: (NOTE) Results confirmed on dilution.         Relative Risk for Future Cardiovascular Event                             Low                 <1.00                             Average       1.00 - 3.00                              High                >3.00 Performed At: [facility] [place], [place] [id number] Rush Farmer MD [id number]   Hepatitis B surface antigen     Status: None   Collection Time: 04/12/20  9:05 AM  Result Value Ref Range   Hepatitis B Surface Ag NON REACTIVE NON REACTIVE    Comment: Performed at [place] [address], [place], [place] [address]  CBC WITH DIFFERENTIAL     Status: Abnormal   Collection Time: 04/13/20  5:33 AM  Result Value Ref Range   WBC 7.7 4.0 - 10.5 K/uL   RBC 3.75 (L) 4.22 - 5.81 MIL/uL   Hemoglobin 11.2 (L) 13.0 - 17.0 g/dL   HCT 34.7 (L) 39.0 - 52.0 %   MCV 92.5 80.0 - 100.0 fL   MCH 29.9 26.0 - 34.0 pg   MCHC 32.3 30.0 - 36.0 g/dL   RDW 14.1 11.5 - 15.5 %   Platelets 451 (H) 150 - 400 K/uL   nRBC 0.3 (H) 0.0 - 0.2 %   Neutrophils Relative % 67 %   Neutro Abs 5.2 1.7 - 7.7 K/uL   Lymphocytes Relative 12 %   Lymphs Abs 0.9 0.7 - 4.0 K/uL   Monocytes Relative 15 %   Monocytes Absolute 1.1 (H) 0.1 - 1.0 K/uL   Eosinophils Relative 0 %   Eosinophils Absolute 0.0 0.0 - 0.5 K/uL   Basophils Relative 1 %   Basophils Absolute 0.0 0.0 - 0.1 K/uL   WBC Morphology MILD LEFT SHIFT (1-5% METAS, OCC MYELO, OCC BANDS)     Comment: TOXIC GRANULATION   Immature Granulocytes 5 %  Abs Immature Granulocytes 0.42 (H) 0.00 - 0.07 K/uL   Polychromasia PRESENT     Comment: Performed at [facility], [place] [address] [place], [place] [address]  Basic metabolic panel     Status: Abnormal   Collection Time: 04/13/20  5:33 AM  Result Value Ref Range   Sodium 142 135 - 145 mmol/L   Potassium 3.7 3.5 - 5.1 mmol/L   Chloride 103 98 - 111 mmol/L   CO2 28 22 - 32 mmol/L   Glucose, Bld 145 (H) 70 - 99 mg/dL    Comment: Glucose reference range applies only to samples taken after fasting for at least 8 hours.   BUN 13 6 - 20 mg/dL   Creatinine, Ser 0.91 0.61 - 1.24 mg/dL   Calcium 7.8 (L) 8.9 - 10.3 mg/dL   GFR  calc non Af Amer >60 >60 mL/min   GFR calc Af Amer >60 >60 mL/min   Anion gap 11 5 - 15    Comment: Performed at [facility], [place] [address], [place], [place] [address]    Studies/Results: No results found.  Medications: I have reviewed the patient's current medications.  Assessment: Ulcerative colitis-flare likely related to noncompliance Elevated CRP of 40.92 and ESR of 30 Downtrending platelet, likely sign of resolving inflammation Hep B surface antigen negative TPMT and TB Gold testing pending  Plan: Okay to discharge patient home today. I have sent prescriptions to his pharmacy- Asacol HD 800 mg 2 pills 3 times daily, and  tapering dose of prednisone(40 mg daily for a week/30 mg daily for a week/20 mg daily for a week/10 mg daily for a week/then stop). Patient will need pantoprazole 40 mg daily while on prednisone for ulcer prophylaxis.  I will make arrangements for the patient to follow-up with me in about 10 days    Patient verbalized understanding and consents.  Ronnette Juniper, MD 04/13/2020, 8:34 AM

## 2020-04-13 NOTE — Progress Notes (Signed)
Pt VS WNL this am.  No complaints of pain.  Discharge instructions provided to and reviewed with patient.  PIV discontinued.  Catheter intact and clotting within expected timeframe.  Pt ambulated off of unit.

## 2020-04-14 LAB — QUANTIFERON-TB GOLD PLUS (RQFGPL)
QuantiFERON Mitogen Value: 0.16 IU/mL
QuantiFERON Nil Value: 0 IU/mL
QuantiFERON TB1 Ag Value: 0 IU/mL
QuantiFERON TB2 Ag Value: 0 IU/mL

## 2020-04-14 LAB — QUANTIFERON-TB GOLD PLUS: QuantiFERON-TB Gold Plus: UNDETERMINED — AB

## 2020-04-19 ENCOUNTER — Other Ambulatory Visit: Payer: Self-pay | Admitting: Gastroenterology

## 2020-04-19 ENCOUNTER — Ambulatory Visit
Admission: RE | Admit: 2020-04-19 | Discharge: 2020-04-19 | Disposition: A | Discharge status: Home / Self Care | Payer: BC Managed Care – PPO | Source: Ambulatory Visit | Attending: Gastroenterology | Admitting: Gastroenterology

## 2020-04-19 ENCOUNTER — Other Ambulatory Visit: Payer: Self-pay

## 2020-04-19 DIAGNOSIS — Z227 Latent tuberculosis: Secondary | ICD-10-CM

## 2020-04-19 DIAGNOSIS — R918 Other nonspecific abnormal finding of lung field: Secondary | ICD-10-CM | POA: Diagnosis not present

## 2020-04-19 LAB — THIOPURINE METHYLTRANSFERASE (TPMT), RBC: TPMT Activity:: 24.1 Units/mL RBC

## 2020-04-20 DIAGNOSIS — R7611 Nonspecific reaction to tuberculin skin test without active tuberculosis: Secondary | ICD-10-CM | POA: Diagnosis not present

## 2020-04-21 DIAGNOSIS — K51018 Ulcerative (chronic) pancolitis with other complication: Secondary | ICD-10-CM | POA: Diagnosis not present

## 2020-10-18 DIAGNOSIS — E785 Hyperlipidemia, unspecified: Secondary | ICD-10-CM | POA: Diagnosis not present

## 2020-10-18 DIAGNOSIS — R739 Hyperglycemia, unspecified: Secondary | ICD-10-CM | POA: Diagnosis not present

## 2020-10-18 DIAGNOSIS — Z125 Encounter for screening for malignant neoplasm of prostate: Secondary | ICD-10-CM | POA: Diagnosis not present

## 2020-10-18 DIAGNOSIS — K51911 Ulcerative colitis, unspecified with rectal bleeding: Secondary | ICD-10-CM | POA: Diagnosis not present

## 2020-10-18 DIAGNOSIS — I10 Essential (primary) hypertension: Secondary | ICD-10-CM | POA: Diagnosis not present

## 2020-10-18 DIAGNOSIS — L918 Other hypertrophic disorders of the skin: Secondary | ICD-10-CM | POA: Diagnosis not present

## 2020-10-18 DIAGNOSIS — K51018 Ulcerative (chronic) pancolitis with other complication: Secondary | ICD-10-CM | POA: Diagnosis not present

## 2020-12-09 IMAGING — CT CT ABD-PELV W/ CM
2 of 5 series · 15 of 46 positions shown, 17 images · IV contrast (omnipaque)
Comparison: None.

CLINICAL DATA: Ulcer colitis.

EXAM:
CT ABDOMEN AND PELVIS WITH CONTRAST
TECHNIQUE: Multidetector CT imaging of the abdomen and pelvis was performed
using the standard protocol following bolus administration of
intravenous contrast.
CONTRAST:  100mL OMNIPAQUE IOHEXOL 300 MG/ML  SOLN

[Series 2: axial st · axial · 0.86mm/px · z∈[-697,-267]mm · 12 of 102 slices shown, 14 images]
[im 8/102  soft-tissue]
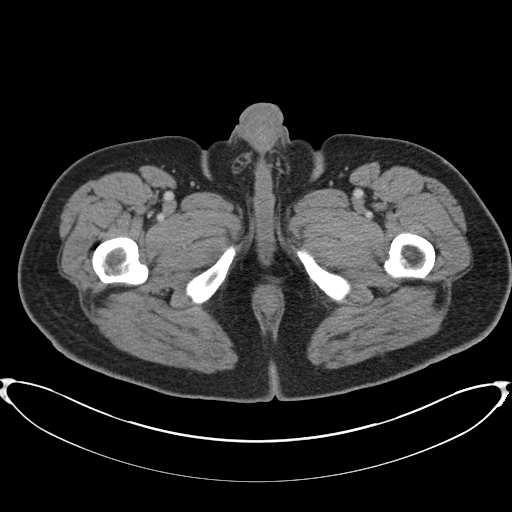
[im 8/102  bone]
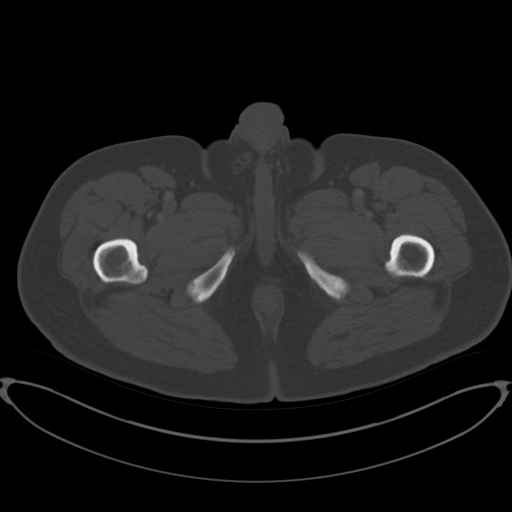
[im 16/102  soft-tissue]
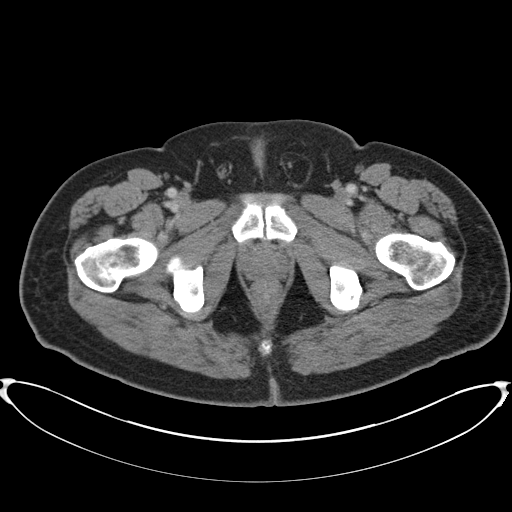
[im 24/102  soft-tissue]
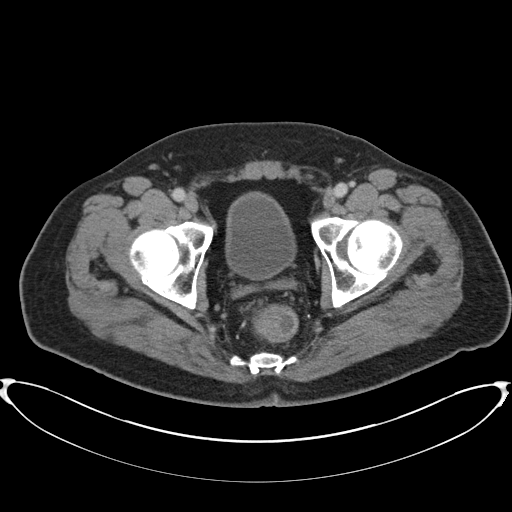
[im 32/102  soft-tissue]
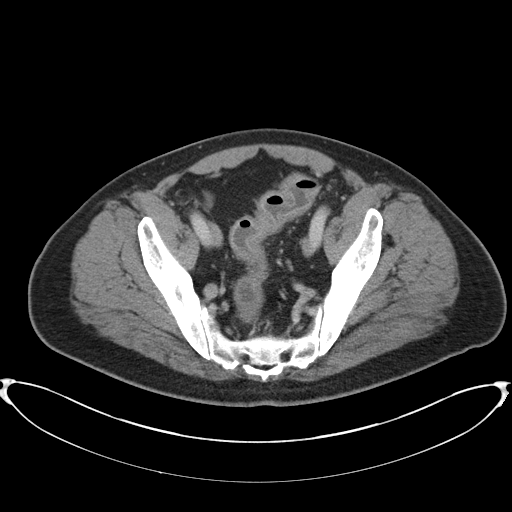
[im 39/102  soft-tissue]
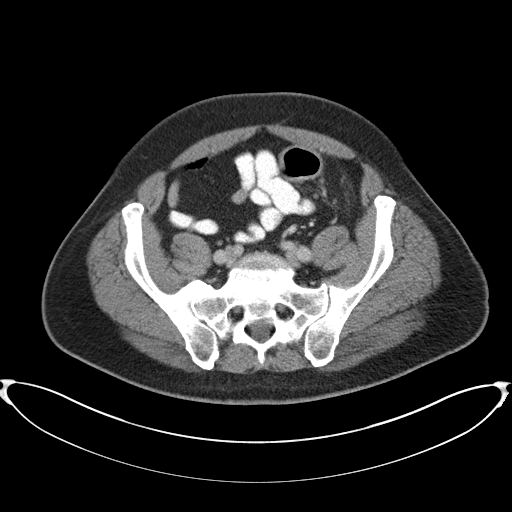
[im 47/102  soft-tissue]
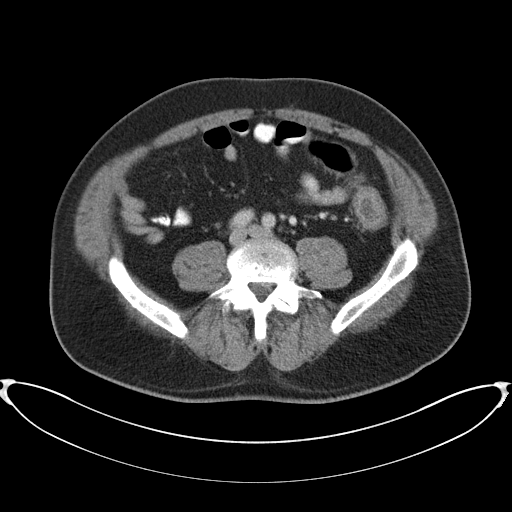
[im 55/102  soft-tissue]
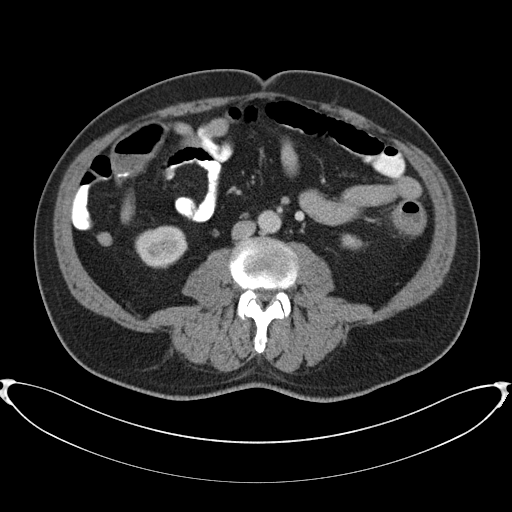
[im 63/102  soft-tissue]
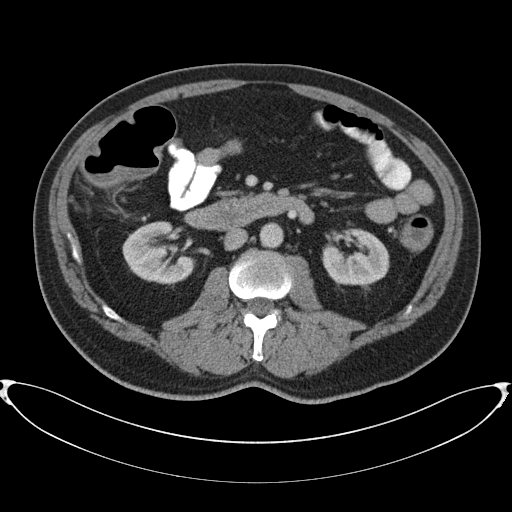
[im 70/102  soft-tissue]
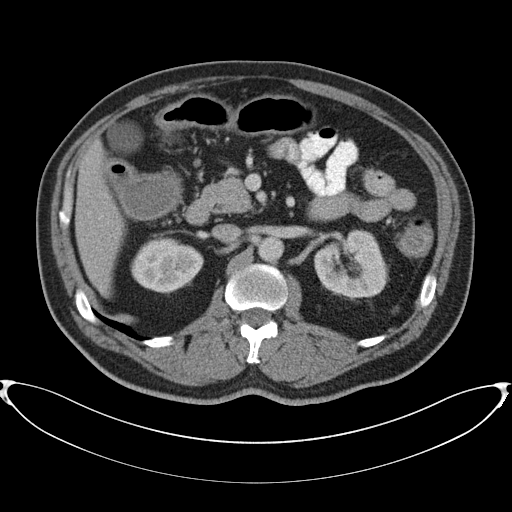
[im 70/102  bone]
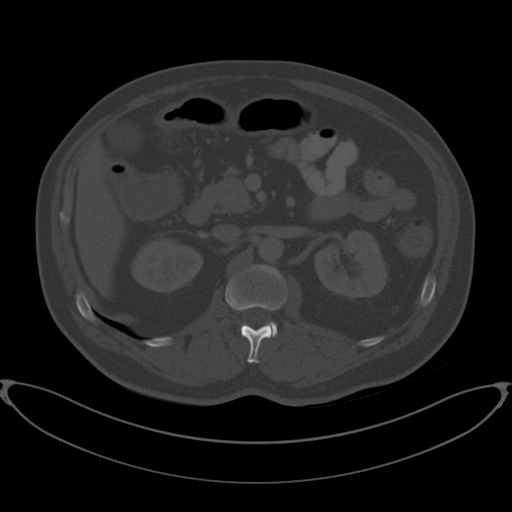
[im 78/102  soft-tissue]
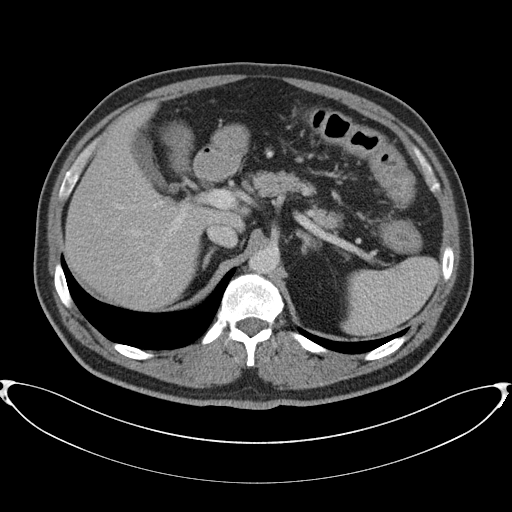
[im 86/102  soft-tissue]
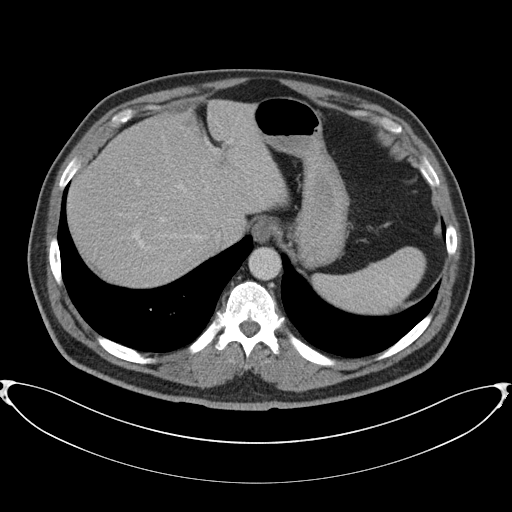
[im 94/102  soft-tissue]
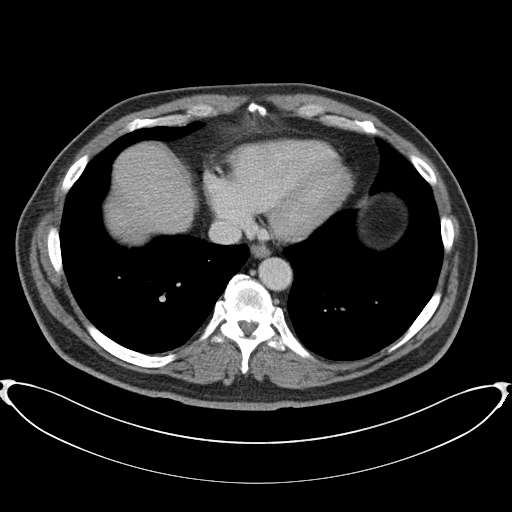

[Series 5: coronal st · coronal · 0.86mm/px · 3 of 160 slices shown]
[im 54/160  soft-tissue]
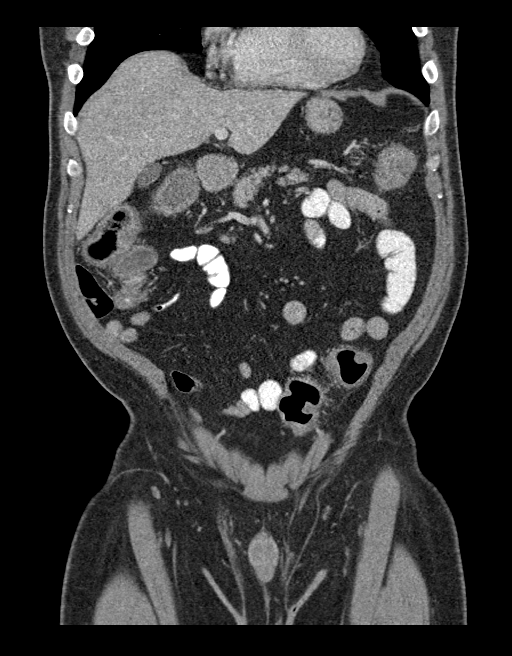
[im 71/160  soft-tissue]
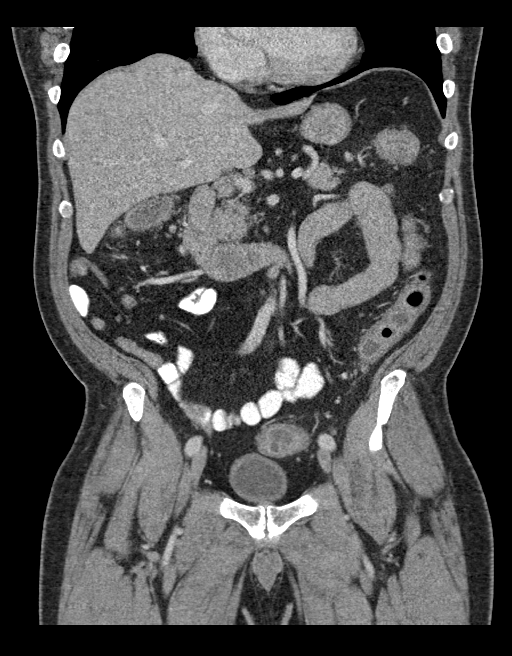
[im 89/160  soft-tissue]
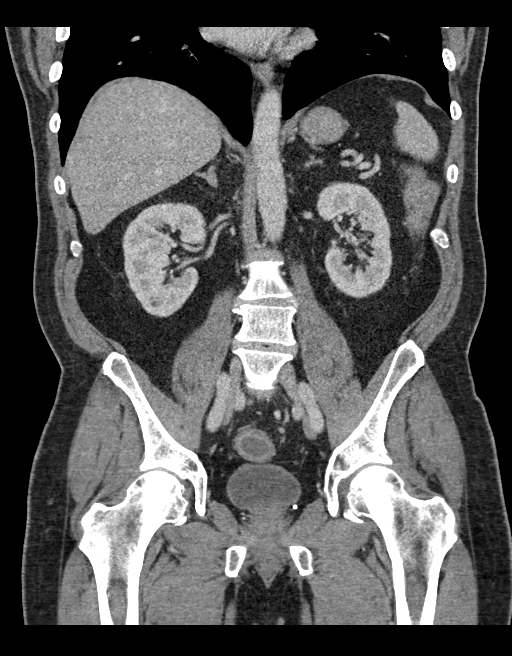

[15 of 46 positions shown; findings below may reference images not displayed]

FINDINGS: Lower chest: The lung bases are clear. The heart size is normal.

Hepatobiliary: The liver is normal. Normal gallbladder.There is no
biliary ductal dilation.

Pancreas: Normal contours without ductal dilatation. No
peripancreatic fluid collection.

Spleen: Unremarkable.

Adrenals/Urinary Tract:

--Adrenal glands: Unremarkable.

--Right kidney/ureter: No hydronephrosis or radiopaque kidney
stones.

--Left kidney/ureter: No hydronephrosis or radiopaque kidney stones.

--Urinary bladder: Unremarkable.

Stomach/Bowel:

--Stomach/Duodenum: No hiatal hernia or other gastric abnormality.
Normal duodenal course and caliber.

--Small bowel: Unremarkable.

--Colon: There is diffuse wall thickening the entire colon.
Air-fluid levels are colon.

--Appendix: Normal.

Vascular/Lymphatic: Normal course and caliber of the major abdominal
vessels.

--No retroperitoneal lymphadenopathy.

--multiple enlarged likely reactive regional nodes

--No pelvic or inguinal lymphadenopathy.

Reproductive: Unremarkable

Other: No ascites or free air. Bilateral fat containing inguinal
hernias left than right

Musculoskeletal. No acute displaced fractures.
IMPRESSION: Findings are consistent with infectious or inflammatory pancolitis.

## 2021-03-04 DIAGNOSIS — U071 COVID-19: Secondary | ICD-10-CM | POA: Diagnosis not present

## 2021-05-30 DIAGNOSIS — R197 Diarrhea, unspecified: Secondary | ICD-10-CM | POA: Diagnosis not present

## 2021-05-30 DIAGNOSIS — K51018 Ulcerative (chronic) pancolitis with other complication: Secondary | ICD-10-CM | POA: Diagnosis not present

## 2021-08-07 DIAGNOSIS — L219 Seborrheic dermatitis, unspecified: Secondary | ICD-10-CM | POA: Diagnosis not present

## 2021-08-07 DIAGNOSIS — L720 Epidermal cyst: Secondary | ICD-10-CM | POA: Diagnosis not present

## 2021-08-07 DIAGNOSIS — L918 Other hypertrophic disorders of the skin: Secondary | ICD-10-CM | POA: Diagnosis not present

## 2021-09-29 DIAGNOSIS — I10 Essential (primary) hypertension: Secondary | ICD-10-CM | POA: Diagnosis not present

## 2021-09-29 DIAGNOSIS — E785 Hyperlipidemia, unspecified: Secondary | ICD-10-CM | POA: Diagnosis not present

## 2021-09-29 DIAGNOSIS — E663 Overweight: Secondary | ICD-10-CM | POA: Diagnosis not present

## 2021-09-29 DIAGNOSIS — K51911 Ulcerative colitis, unspecified with rectal bleeding: Secondary | ICD-10-CM | POA: Diagnosis not present

## 2021-10-27 DIAGNOSIS — E785 Hyperlipidemia, unspecified: Secondary | ICD-10-CM | POA: Diagnosis not present

## 2021-10-27 DIAGNOSIS — Z125 Encounter for screening for malignant neoplasm of prostate: Secondary | ICD-10-CM | POA: Diagnosis not present

## 2021-10-27 DIAGNOSIS — R739 Hyperglycemia, unspecified: Secondary | ICD-10-CM | POA: Diagnosis not present

## 2021-10-27 DIAGNOSIS — M170 Bilateral primary osteoarthritis of knee: Secondary | ICD-10-CM | POA: Diagnosis not present

## 2021-10-27 DIAGNOSIS — K51911 Ulcerative colitis, unspecified with rectal bleeding: Secondary | ICD-10-CM | POA: Diagnosis not present

## 2021-12-05 DIAGNOSIS — E669 Obesity, unspecified: Secondary | ICD-10-CM | POA: Diagnosis not present

## 2021-12-05 DIAGNOSIS — R7303 Prediabetes: Secondary | ICD-10-CM | POA: Diagnosis not present

## 2021-12-07 DIAGNOSIS — I10 Essential (primary) hypertension: Secondary | ICD-10-CM | POA: Diagnosis not present

## 2021-12-07 DIAGNOSIS — R7303 Prediabetes: Secondary | ICD-10-CM | POA: Diagnosis not present

## 2021-12-07 DIAGNOSIS — K51911 Ulcerative colitis, unspecified with rectal bleeding: Secondary | ICD-10-CM | POA: Diagnosis not present

## 2021-12-07 DIAGNOSIS — E785 Hyperlipidemia, unspecified: Secondary | ICD-10-CM | POA: Diagnosis not present

## 2021-12-07 DIAGNOSIS — R002 Palpitations: Secondary | ICD-10-CM | POA: Diagnosis not present

## 2021-12-26 ENCOUNTER — Ambulatory Visit (INDEPENDENT_AMBULATORY_CARE_PROVIDER_SITE_OTHER): Payer: BC Managed Care – PPO

## 2021-12-26 ENCOUNTER — Encounter: Payer: Self-pay | Admitting: Cardiology

## 2021-12-26 ENCOUNTER — Other Ambulatory Visit: Payer: Self-pay

## 2021-12-26 ENCOUNTER — Ambulatory Visit: Payer: BC Managed Care – PPO | Admitting: Cardiology

## 2021-12-26 VITALS — BP 140/100 | HR 92 | Ht 69.0 in | Wt 231.0 lb

## 2021-12-26 DIAGNOSIS — R9431 Abnormal electrocardiogram [ECG] [EKG]: Secondary | ICD-10-CM

## 2021-12-26 DIAGNOSIS — R002 Palpitations: Secondary | ICD-10-CM | POA: Diagnosis not present

## 2021-12-26 DIAGNOSIS — R079 Chest pain, unspecified: Secondary | ICD-10-CM

## 2021-12-26 DIAGNOSIS — Z01812 Encounter for preprocedural laboratory examination: Secondary | ICD-10-CM

## 2021-12-26 MED ORDER — METOPROLOL TARTRATE 100 MG PO TABS: 100.0000 mg | ORAL_TABLET | Freq: Once | ORAL | 0 refills | Status: DC

## 2021-12-26 NOTE — Assessment & Plan Note (Addendum)
We will go ahead and check a coronary CT scan with possible FFR analysis.  We want to make sure he does not have any flow-limiting coronary artery disease.  If calcified plaque is noted, we will intensify his statin regimen.  He is currently on atorvastatin 40 mg a day.  LDL 94  We will check an echocardiogram to ensure proper structure and function of his heart

## 2021-12-26 NOTE — Progress Notes (Signed)
Cardiology Office Note:    Date:  12/26/2021   ID:  Brandon Watkins, DOB 12-21-1960, MRN [id number]  PCP:  Vernie Shanks, MD   [facility] Providers Cardiologist:  None     Referring MD: Vernie Shanks, MD    History of Present Illness:    Brandon Watkins is a 61 y.o. male here for the evaluation of palpitations at the request of Dr. Jacelyn Grip.  Has been feeling his heart beating faster, harder sometimes.  Sometimes when he stands up he will feel a fullness in his chest.  Sometimes when he would take a deep breath it will go away.  EKG personally reviewed and interpreted shows sinus rhythm 62 with no other abnormalities.  Takes metoprolol 25 mg a day.  His grandfather had heart attack.  His father died of congestive heart failure, had a pacemaker.  Was recently given [facility] for prediabetes. Non-smoker.  Prior gastritis.  Takes pantoprazole.  Blood pressure at times has been high.  At home mostly in the 130s over 80s.  He will continue to monitor.  Has been on losartan/hydrochlorothiazide.  I did not see metoprolol on his list.   Past Medical History:  Diagnosis Date   Anxiety    Essential hypertension    IBS (irritable bowel syndrome)    Mixed dyslipidemia    Obesity    Stress    Umbilical hernia     Past Surgical History:  Procedure Laterality Date   COLONOSCOPY     REPAIR OF DIGITAL NERVE     UMBILICAL HERNIA REPAIR N/A 03/30/2019   Procedure: UMBILICAL HERNIA REPAIR WITH MESH;  Surgeon: Jovita Kussmaul, MD;  Location: [place];  Service: General;  Laterality: N/A;    Current Medications: Current Meds  Medication Sig   atorvastatin (LIPITOR) 40 MG tablet Take 40 mg by mouth daily.   diphenhydramine-acetaminophen (TYLENOL PM) 25-500 MG TABS tablet Take 1 tablet by mouth at bedtime as needed (sleep).   escitalopram (LEXAPRO) 10 MG tablet Take 10 mg by mouth daily.   losartan-hydrochlorothiazide (HYZAAR) 100-12.5 MG tablet Take 1 tablet by mouth daily.    MESALAMINE PO Take 800 mg by mouth daily.   metoprolol tartrate (LOPRESSOR) 100 MG tablet Take 1 tablet (100 mg total) by mouth once for 1 dose. Take one tablet 2 hours before your CT scan   Multiple Vitamin (MULTIVITAMIN) tablet Take 1 tablet by mouth daily.   OVER THE COUNTER MEDICATION Take 1 Dose by mouth daily. ( GABA CALM)   [DISCONTINUED] Mesalamine (ASACOL HD) 800 MG TBEC Take 2 tablets (1,600 mg total) by mouth in the morning, at noon, and at bedtime.   [DISCONTINUED] pantoprazole (PROTONIX) 40 MG tablet Take 1 tablet (40 mg total) by mouth at bedtime.     Allergies:   Lipitor [atorvastatin] and Prednisone   Social History   Socioeconomic History   Marital status: Married    Spouse name: Not on file   Number of children: Not on file   Years of education: Not on file   Highest education level: Not on file  Occupational History   Not on file  Tobacco Use   Smoking status: Never   Smokeless tobacco: Never  Substance and Sexual Activity   Alcohol use: Yes    Alcohol/week: 0.0 standard drinks    Comment: social   Drug use: No   Sexual activity: Not on file  Other Topics Concern   Not on file  Social History Narrative  Not on file   Social Determinants of Health   Financial Resource Strain: Not on file  Food Insecurity: Not on file  Transportation Needs: Not on file  Physical Activity: Not on file  Stress: Not on file  Social Connections: Not on file     Family History: The patient's family history includes Diabetes in his sister; Hyperlipidemia in his mother; Hypertension in his father and mother.  ROS:   Please see the history of present illness.     All other systems reviewed and are negative.  EKGs/Labs/Other Studies Reviewed:    The following studies were reviewed today: Prior office notes reviewed  EKG:  EKG is  ordered today.  The ekg ordered today demonstrates sinus rhythm 62 with left atrial enlargement and incomplete right bundle branch  block  Recent Labs: No results found for requested labs within last 8760 hours.  Recent Lipid Panel No results found for: CHOL, TRIG, HDL, CHOLHDL, VLDL, LDLCALC, LDLDIRECT   Risk Assessment/Calculations:              Physical Exam:    VS:  BP (!) 140/100 (BP Location: Left Arm, Patient Position: Sitting, Cuff Size: Normal)    Pulse 92    Ht 5\' 9"  (1.753 m)    Wt 231 lb (104.8 kg)    SpO2 96%    BMI 34.11 kg/m     Wt Readings from Last 3 Encounters:  12/26/21 231 lb (104.8 kg)  04/10/20 217 lb (98.4 kg)  03/30/19 239 lb 3.2 oz (108.5 kg)     GEN:  Well nourished, well developed in no acute distress HEENT: Normal NECK: No JVD; No carotid bruits LYMPHATICS: No lymphadenopathy CARDIAC: RRR, no murmurs, no rubs, gallops RESPIRATORY:  Clear to auscultation without rales, wheezing or rhonchi  ABDOMEN: Soft, non-tender, non-distended MUSCULOSKELETAL:  No edema; No deformity  SKIN: Warm and dry NEUROLOGIC:  Alert and oriented x 3 PSYCHIATRIC:  Normal affect   ASSESSMENT:    1. Abnormal electrocardiogram   2. Palpitations   3. Pre-procedure lab exam   4. Chest pain, unspecified type    PLAN:    In order of problems listed above:  Palpitations We will check a Zio patch monitor to ensure that he is not having any adverse arrhythmias.  By clinical description likely PVCs or PACs.  Caffeine reduction, good sleep hygiene, daily exercise can be helpful in this situation.  Chest pain We will go ahead and check a coronary CT scan with possible FFR analysis.  We want to make sure he does not have any flow-limiting coronary artery disease.  If calcified plaque is noted, we will intensify his statin regimen.  He is currently on atorvastatin 40 mg a day.  LDL 94  We will check an echocardiogram to ensure proper structure and function of his heart  Mixed dyslipidemia On atorvastatin 40 mg LDL 94.  We will check and see if he is got any plaque.  We may need to intensify regimen.          Medication Adjustments/Labs and Tests Ordered: Current medicines are reviewed at length with the patient today.  Concerns regarding medicines are outlined above.  Orders Placed This Encounter  Procedures   CT CORONARY MORPH W/CTA COR W/SCORE W/CA W/CM &/OR WO/CM   Basic metabolic panel   LONG TERM MONITOR (3-14 DAYS)   ECHOCARDIOGRAM COMPLETE   Meds ordered this encounter  Medications   metoprolol tartrate (LOPRESSOR) 100 MG tablet    Sig:  Take 1 tablet (100 mg total) by mouth once for 1 dose. Take one tablet 2 hours before your CT scan    Dispense:  1 tablet    Refill:  0    Patient Instructions  Medication Instructions:  The current medical regimen is effective;  continue present plan and medications.  *If you need a refill on your cardiac medications before your next appointment, please call your pharmacy*  Lab Work: Please have blood work today (BMP) If you have labs (blood work) drawn today and your tests are completely normal, you will receive your results only by: MyChart Message (if you have MyChart) OR A paper copy in the mail If you have any lab test that is abnormal or we need to change your treatment, we will call you to review the results.  Testing/Procedures: Your physician has requested that you have an echocardiogram. Echocardiography is a painless test that uses sound waves to create images of your heart. It provides your doctor with information about the size and shape of your heart and how well your hearts chambers and valves are working. This procedure takes approximately one hour. There are no restrictions for this procedure.    Your cardiac CT will be scheduled at:   [facility] [address] [place], [place] [address] [phone]  Please arrive at the [facility] main entrance (entrance A) of [facility] 30 minutes prior to test start time. You can use the FREE valet parking offered at the main entrance  (encouraged to control the heart rate for the test) Proceed to the [facility] Radiology Department (first floor) to check-in and test prep.  Please follow these instructions carefully (unless otherwise directed):  Hold all erectile dysfunction medications at least 3 days (72 hrs) prior to test.  On the Night Before the Test: Be sure to Drink plenty of water. Do not consume any caffeinated/decaffeinated beverages or chocolate 12 hours prior to your test. Do not take any antihistamines 12 hours prior to your test.  On the Day of the Test: Drink plenty of water until 1 hour prior to the test. Do not eat any food 4 hours prior to the test. You may take your regular medications prior to the test.  Take metoprolol (Lopressor) two hours prior to test.  After the Test: Drink plenty of water. After receiving IV contrast, you may experience a mild flushed feeling. This is normal. On occasion, you may experience a mild rash up to 24 hours after the test. This is not dangerous. If this occurs, you can take Benadryl 25 mg and increase your fluid intake. If you experience trouble breathing, this can be serious. If it is severe call 911 IMMEDIATELY. If it is mild, please call our office.  We will call to schedule your test 2-4 weeks out understanding that some insurance companies will need an authorization prior to the service being performed.   For non-scheduling related questions, please contact the cardiac imaging nurse navigator should you have any questions/concerns: Marchia Bond, Cardiac Imaging Nurse Navigator Gordy Clement, Cardiac Imaging Nurse Navigator [facility] Direct Office Dial: [phone]   For scheduling needs, including cancellations and rescheduling, please call [place], [phone].  ZIO XT- Long Term Monitor Instructions  Your physician has requested you wear a ZIO patch monitor for 14 days.  This is a single patch monitor. [employer] supplies  one patch monitor per enrollment. Additional stickers are not available. Please do not apply patch  if you will be having a Nuclear Stress Test,  Echocardiogram, Cardiac CT, MRI, or Chest Xray during the period you would be wearing the  monitor. The patch cannot be worn during these tests. You cannot remove and re-apply the  ZIO XT patch monitor.  Your ZIO patch monitor will be mailed 3 day USPS to your address on file. It may take 3-5 days  to receive your monitor after you have been enrolled.  Once you have received your monitor, please review the enclosed instructions. Your monitor  has already been registered assigning a specific monitor serial # to you.  Billing and Patient Assistance Program Information  We have supplied [employer] with any of your insurance information on file for billing purposes. [employer] offers a sliding scale Patient Assistance Program for patients that do not have  insurance, or whose insurance does not completely cover the cost of the ZIO monitor.  You must apply for the Patient Assistance Program to qualify for this discounted rate.  To apply, please call [employer] at [phone], select option 4, select option 2, ask to apply for  Patient Assistance Program. Theodore Demark will ask your household income, and how many people  are in your household. They will quote your out-of-pocket cost based on that information.  [employer] will also be able to set up a 84-month, interest-free payment plan if needed.  Applying the monitor   Shave hair from upper left chest.  Hold abrader disc by orange tab. Rub abrader in 40 strokes over the upper left chest as  indicated in your monitor instructions.  Clean area with 4 enclosed alcohol pads. Let dry.  Apply patch as indicated in monitor instructions. Patch will be placed under collarbone on left  side of chest with arrow pointing upward.  Rub patch adhesive wings for 2 minutes. Remove white label marked "1". Remove the white  label  marked "2". Rub patch adhesive wings for 2 additional minutes.  While looking in a mirror, press and release button in center of patch. A small green light will  flash 3-4 times. This will be your only indicator that the monitor has been turned on.  Do not shower for the first 24 hours. You may shower after the first 24 hours.  Press the button if you feel a symptom. You will hear a small click. Record Date, Time and  Symptom in the Patient Logbook.  When you are ready to remove the patch, follow instructions on the last 2 pages of Patient  Logbook. Stick patch monitor onto the last page of Patient Logbook.  Place Patient Logbook in the blue and white box. Use locking tab on box and tape box closed  securely. The blue and white box has prepaid postage on it. Please place it in the mailbox as  soon as possible. Your physician should have your test results approximately 7 days after the  monitor has been mailed back to [facility].  Call [place] at [phone] if you have questions regarding  your ZIO XT patch monitor. Call them immediately if you see an orange light blinking on your  monitor.  If your monitor falls off in less than 4 days, contact our Monitor department at [phone].  If your monitor becomes loose or falls off after 4 days call [employer] at [phone] for  suggestions on securing your monitor  Follow-Up: At [facility], you and your health needs are our priority.  As part of our continuing mission to provide you with  exceptional heart care, we have created designated Provider Care Teams.  These Care Teams include your primary Cardiologist (physician) and Advanced Practice Providers (APPs -  Physician Assistants and Nurse Practitioners) who all work together to provide you with the care you need, when you need it.  We recommend signing up for the patient portal called "MyChart".  Sign up information is provided on this After Visit Summary.   MyChart is used to connect with patients for Virtual Visits (Telemedicine).  Patients are able to view lab/test results, encounter notes, upcoming appointments, etc.  Non-urgent messages can be sent to your provider as well.   To learn more about what you can do with MyChart, go to [email or web address].    Your next appointment:   3 month(s)  The format for your next appointment:   In Person  Provider:   Robbie Lis, PA-C, Nicholes Rough, PA-C, Dayna Dunn, PA-C, Ermalinda Barrios, PA-C, Christen Bame, NP, or Richardson Dopp, PA-C        Thank you for choosing [facility]!!      Signed, Candee Furbish, MD  12/26/2021 3:43 PM    [place]

## 2021-12-26 NOTE — Patient Instructions (Addendum)
Medication Instructions:  The current medical regimen is effective;  continue present plan and medications.  *If you need a refill on your cardiac medications before your next appointment, please call your pharmacy*  Lab Work: Please have blood work today (BMP) If you have labs (blood work) drawn today and your tests are completely normal, you will receive your results only by: MyChart Message (if you have MyChart) OR A paper copy in the mail If you have any lab test that is abnormal or we need to change your treatment, we will call you to review the results.  Testing/Procedures: Your physician has requested that you have an echocardiogram. Echocardiography is a painless test that uses sound waves to create images of your heart. It provides your doctor with information about the size and shape of your heart and how well your hearts chambers and valves are working. This procedure takes approximately one hour. There are no restrictions for this procedure.    Your cardiac CT will be scheduled at:   [facility] [address] [place], [place] [address] [phone]  Please arrive at the [facility] main entrance (entrance A) of [facility] 30 minutes prior to test start time. You can use the FREE valet parking offered at the main entrance (encouraged to control the heart rate for the test) Proceed to the [facility] Radiology Department (first floor) to check-in and test prep.  Please follow these instructions carefully (unless otherwise directed):  Hold all erectile dysfunction medications at least 3 days (72 hrs) prior to test.  On the Night Before the Test: Be sure to Drink plenty of water. Do not consume any caffeinated/decaffeinated beverages or chocolate 12 hours prior to your test. Do not take any antihistamines 12 hours prior to your test.  On the Day of the Test: Drink plenty of water until 1 hour prior to the test. Do not eat any food 4 hours prior  to the test. You may take your regular medications prior to the test.  Take metoprolol (Lopressor) two hours prior to test.  After the Test: Drink plenty of water. After receiving IV contrast, you may experience a mild flushed feeling. This is normal. On occasion, you may experience a mild rash up to 24 hours after the test. This is not dangerous. If this occurs, you can take Benadryl 25 mg and increase your fluid intake. If you experience trouble breathing, this can be serious. If it is severe call 911 IMMEDIATELY. If it is mild, please call our office.  We will call to schedule your test 2-4 weeks out understanding that some insurance companies will need an authorization prior to the service being performed.   For non-scheduling related questions, please contact the cardiac imaging nurse navigator should you have any questions/concerns: Marchia Bond, Cardiac Imaging Nurse Navigator Gordy Clement, Cardiac Imaging Nurse Navigator [place] [facility] Direct Office Dial: [phone]   For scheduling needs, including cancellations and rescheduling, please call [place], [phone].  ZIO XT- Long Term Monitor Instructions  Your physician has requested you wear a ZIO patch monitor for 14 days.  This is a single patch monitor. [employer] supplies one patch monitor per enrollment. Additional stickers are not available. Please do not apply patch if you will be having a Nuclear Stress Test,  Echocardiogram, Cardiac CT, MRI, or Chest Xray during the period you would be wearing the  monitor. The patch cannot be worn during these tests. You cannot remove and re-apply the  ZIO XT patch monitor.  Your ZIO patch monitor will be mailed 3 day USPS to your address on file. It may take 3-5 days  to receive your monitor after you have been enrolled.  Once you have received your monitor, please review the enclosed instructions. Your monitor  has already been registered assigning a  specific monitor serial # to you.  Billing and Patient Assistance Program Information  We have supplied [employer] with any of your insurance information on file for billing purposes. [employer] offers a sliding scale Patient Assistance Program for patients that do not have  insurance, or whose insurance does not completely cover the cost of the ZIO monitor.  You must apply for the Patient Assistance Program to qualify for this discounted rate.  To apply, please call [employer] at [phone], select option 4, select option 2, ask to apply for  Patient Assistance Program. Theodore Demark will ask your household income, and how many people  are in your household. They will quote your out-of-pocket cost based on that information.  [employer] will also be able to set up a 67-month, interest-free payment plan if needed.  Applying the monitor   Shave hair from upper left chest.  Hold abrader disc by orange tab. Rub abrader in 40 strokes over the upper left chest as  indicated in your monitor instructions.  Clean area with 4 enclosed alcohol pads. Let dry.  Apply patch as indicated in monitor instructions. Patch will be placed under collarbone on left  side of chest with arrow pointing upward.  Rub patch adhesive wings for 2 minutes. Remove white label marked "1". Remove the white  label marked "2". Rub patch adhesive wings for 2 additional minutes.  While looking in a mirror, press and release button in center of patch. A small green light will  flash 3-4 times. This will be your only indicator that the monitor has been turned on.  Do not shower for the first 24 hours. You may shower after the first 24 hours.  Press the button if you feel a symptom. You will hear a small click. Record Date, Time and  Symptom in the Patient Logbook.  When you are ready to remove the patch, follow instructions on the last 2 pages of Patient  Logbook. Stick patch monitor onto the last page of Patient Logbook.  Place Patient  Logbook in the blue and white box. Use locking tab on box and tape box closed  securely. The blue and white box has prepaid postage on it. Please place it in the mailbox as  soon as possible. Your physician should have your test results approximately 7 days after the  monitor has been mailed back to [facility].  Call [place] at [phone] if you have questions regarding  your ZIO XT patch monitor. Call them immediately if you see an orange light blinking on your  monitor.  If your monitor falls off in less than 4 days, contact our Monitor department at [phone].  If your monitor becomes loose or falls off after 4 days call [employer] at [phone] for  suggestions on securing your monitor  Follow-Up: At [facility], you and your health needs are our priority.  As part of our continuing mission to provide you with exceptional heart care, we have created designated Provider Care Teams.  These Care Teams include your primary Cardiologist (physician) and Advanced Practice Providers (APPs -  Physician Assistants and Nurse Practitioners) who all work together to provide you with the care  you need, when you need it.  We recommend signing up for the patient portal called "MyChart".  Sign up information is provided on this After Visit Summary.  MyChart is used to connect with patients for Virtual Visits (Telemedicine).  Patients are able to view lab/test results, encounter notes, upcoming appointments, etc.  Non-urgent messages can be sent to your provider as well.   To learn more about what you can do with MyChart, go to [email or web address].    Your next appointment:   3 month(s)  The format for your next appointment:   In Person  Provider:   Robbie Lis, PA-C, Nicholes Rough, PA-C, Dayna Dunn, PA-C, Ermalinda Barrios, PA-C, Christen Bame, NP, or Richardson Dopp, PA-C        Thank you for choosing [facility]!!

## 2021-12-26 NOTE — Assessment & Plan Note (Signed)
On atorvastatin 40 mg LDL 94.  We will check and see if he is got any plaque.  We may need to intensify regimen.

## 2021-12-26 NOTE — Assessment & Plan Note (Signed)
We will check a Zio patch monitor to ensure that he is not having any adverse arrhythmias.  By clinical description likely PVCs or PACs.  Caffeine reduction, good sleep hygiene, daily exercise can be helpful in this situation.

## 2021-12-26 NOTE — Progress Notes (Unsigned)
Enrolled for [employer] to mail a ZIO XT long term holter monitor to the patients address on file.  

## 2021-12-27 LAB — BASIC METABOLIC PANEL
BUN/Creatinine Ratio: 10 (ref 10–24)
BUN: 11 mg/dL (ref 8–27)
CO2: 24 mmol/L (ref 20–29)
Calcium: 9.9 mg/dL (ref 8.6–10.2)
Chloride: 100 mmol/L (ref 96–106)
Creatinine, Ser: 1.11 mg/dL (ref 0.76–1.27)
Glucose: 84 mg/dL (ref 70–99)
Potassium: 4 mmol/L (ref 3.5–5.2)
Sodium: 139 mmol/L (ref 134–144)
eGFR: 76 mL/min/{1.73_m2} (ref >59)

## 2021-12-27 NOTE — Addendum Note (Signed)
Addended by: Maren Beach, Prestyn Stanco A on: 12/27/2021 09:07 AM   Modules accepted: Orders

## 2022-01-03 ENCOUNTER — Ambulatory Visit: Payer: BC Managed Care – PPO | Admitting: Interventional Cardiology

## 2022-01-09 ENCOUNTER — Telehealth (HOSPITAL_COMMUNITY): Payer: Self-pay | Admitting: *Deleted

## 2022-01-09 NOTE — Telephone Encounter (Signed)
Reaching out to patient to offer assistance regarding upcoming cardiac imaging study; pt verbalizes understanding of appt date/time, parking situation and where to check in, pre-test NPO status and medications ordered, and verified current allergies; name and call back number provided for further questions should they arise  Gordy Clement RN Navigator Cardiac Imaging [facility] [phone] office [phone] cell  Patient to take 100mg  metoprolol tartrate two hours prior to his cardiac CT scan. He is aware to arrive at 11am for his 11:30am scan.

## 2022-01-10 ENCOUNTER — Other Ambulatory Visit: Payer: Self-pay

## 2022-01-10 ENCOUNTER — Ambulatory Visit (HOSPITAL_COMMUNITY)
Admission: RE | Admit: 2022-01-10 | Discharge: 2022-01-10 | Disposition: A | Discharge status: Home / Self Care | Payer: BC Managed Care – PPO | Source: Ambulatory Visit | Attending: Cardiology | Admitting: Cardiology

## 2022-01-10 DIAGNOSIS — R9431 Abnormal electrocardiogram [ECG] [EKG]: Secondary | ICD-10-CM | POA: Diagnosis not present

## 2022-01-10 DIAGNOSIS — R079 Chest pain, unspecified: Secondary | ICD-10-CM | POA: Insufficient documentation

## 2022-01-10 DIAGNOSIS — R002 Palpitations: Secondary | ICD-10-CM | POA: Diagnosis not present

## 2022-01-10 MED ORDER — IOHEXOL 350 MG/ML SOLN
100.0000 mL | Freq: Once | INTRAVENOUS | Status: AC | PRN
  Administered 2022-01-10: 100 mL via INTRAVENOUS

## 2022-01-10 MED ORDER — NITROGLYCERIN 0.4 MG SL SUBL
0.8000 mg | SUBLINGUAL_TABLET | Freq: Once | SUBLINGUAL | Status: AC
  Administered 2022-01-10: 0.8 mg via SUBLINGUAL

## 2022-01-10 MED ORDER — NITROGLYCERIN 0.4 MG SL SUBL
SUBLINGUAL_TABLET | SUBLINGUAL | Status: AC
  Filled 2022-01-10: qty 2

## 2022-01-11 ENCOUNTER — Ambulatory Visit (HOSPITAL_COMMUNITY): Payer: BC Managed Care – PPO | Attending: Cardiology

## 2022-01-11 DIAGNOSIS — R9431 Abnormal electrocardiogram [ECG] [EKG]: Secondary | ICD-10-CM | POA: Diagnosis not present

## 2022-01-11 DIAGNOSIS — R002 Palpitations: Secondary | ICD-10-CM

## 2022-01-11 LAB — ECHOCARDIOGRAM COMPLETE
Area-P 1/2: 2.76 cm2
S' Lateral: 3.2 cm

## 2022-01-12 ENCOUNTER — Telehealth: Payer: Self-pay | Admitting: *Deleted

## 2022-01-12 DIAGNOSIS — E782 Mixed hyperlipidemia: Secondary | ICD-10-CM

## 2022-01-12 DIAGNOSIS — I77810 Thoracic aortic ectasia: Secondary | ICD-10-CM

## 2022-01-12 DIAGNOSIS — Z79899 Other long term (current) drug therapy: Secondary | ICD-10-CM

## 2022-01-12 MED ORDER — ATORVASTATIN CALCIUM 80 MG PO TABS: 80.0000 mg | ORAL_TABLET | Freq: Every day | ORAL | 3 refills | Status: DC

## 2022-01-12 NOTE — Telephone Encounter (Signed)
Coronary calcium score is 1.7, 32nd percentile for age.  Very low.  Minimal calcified plaque in the large RCA.  No evidence of stenosis or trouble with blood flow.  ?Upper normal ascending aorta 3.9 cm (radiologist read).  4.0 cm by Dr. Oval Linsey.  Overall should not be of future clinical significance.  In the next several years could consider repeat echocardiogram for measurements.  Continue to optimize blood pressure control.    ? ?Given the mild plaque noted, lets change the atorvastatin from 40 to 80 mg.  Repeat lipid panel in 3 months.  ? ?Candee Furbish, MD  ? ?Pt aware of results of coronary CT.  He is agreeable to increase Atorvastatin to 80 mg daily and repeat lipid in 3 months.  RX sent into pt's pharmacy of choice.  Lab order placed and scheduled for June 2023.  Pt will monitor his blood pressures at home and keep a log of them.  He will notify the office if they continue to be elevated. ? ?Recently they have been elevated at CT appt.  Pt had taken  his normal Losartan/HCTZ 100-12.5 mg as well as metoprolol tartrate 100 mg 2 hours prior and BP was 135-140/80 before and 140/107 after the CT. ?Advised I will have Dr Marlou Porch to review and call back with any medication changes deemed necessary.  ? ?Also reset pt's MyChart password so that he can now log in to see results.  Reviewed echo results but pt is aware Dr Marlou Porch will be reviewing and any further documentation will be released to him for review.   ?

## 2022-01-14 DIAGNOSIS — R002 Palpitations: Secondary | ICD-10-CM

## 2022-01-17 NOTE — Telephone Encounter (Signed)
From recent echocardiogram: ?Mild aortic root dilatation 43 mm.  ?

## 2022-01-19 NOTE — Telephone Encounter (Signed)
To place order for repeat echo in 1 year: ?Low normal/mildly reduced pump function.  Mild aortic root dilatation 43 mm.  ?Overall reassuring study.  No changes needed.  ?In 1 year, repeat echocardiogram to ensure that there is no significant change in aortic root size.  ? ?Candee Furbish, MD  ? ? ?Dr Marlou Porch is aware of pt's recent blood pressures.  Pt is to continue to monitor blood pressure as previously discussed.   ? ? ?

## 2022-02-22 DIAGNOSIS — R051 Acute cough: Secondary | ICD-10-CM | POA: Diagnosis not present

## 2022-03-14 DIAGNOSIS — R058 Other specified cough: Secondary | ICD-10-CM | POA: Diagnosis not present

## 2022-03-16 DIAGNOSIS — R059 Cough, unspecified: Secondary | ICD-10-CM | POA: Diagnosis not present

## 2022-03-23 DIAGNOSIS — E785 Hyperlipidemia, unspecified: Secondary | ICD-10-CM | POA: Diagnosis not present

## 2022-03-23 DIAGNOSIS — K51911 Ulcerative colitis, unspecified with rectal bleeding: Secondary | ICD-10-CM | POA: Diagnosis not present

## 2022-03-23 DIAGNOSIS — R7303 Prediabetes: Secondary | ICD-10-CM | POA: Diagnosis not present

## 2022-03-23 DIAGNOSIS — J40 Bronchitis, not specified as acute or chronic: Secondary | ICD-10-CM | POA: Diagnosis not present

## 2022-03-23 DIAGNOSIS — R062 Wheezing: Secondary | ICD-10-CM | POA: Diagnosis not present

## 2022-03-24 NOTE — Progress Notes (Signed)
?Cardiology Office Note:   ? ?Date:  03/26/2022  ? ?ID:  [name], DOB Dec 21, 1960, MRN [id number] ? ?PCP:  Vernie Shanks, MD ?  ?[facility] Providers ?Cardiologist:  None    ? ?Referring MD: Vernie Shanks, MD  ? ?Chief Complaint: follow-up chest pain, palpitations ? ?History of Present Illness:   ? ?Brandon Watkins is a very pleasant 61 y.o. male with a hx of hypertension, coronary calcium seen on CT, and palpitations.  ? ?Seen by Dr. Mare Ferrari in 2017 for left-sided chest discomfort.  Echocardiogram 03/2015 revealed LVEF 50-55%, no wma, and exercise Myoview was normal, no ischemia, excellent exercise tolerance.  ? ?He reestablished care 12/26/2021 with Dr. Marlou Porch for evaluation of palpitations, feeling his heart beat faster and harder at times.  Noted at times when he would stand up he would feel chest fullness, at times could take a deep breath and symptoms improved.  Concerned due to strong family history, father had MI, father died of CHF, had a pacemaker. Coronary CTA revealed coronary calcium score of 1.7, 32nd percentile for race/sex matched controls, minimal (<25%) calcified plaque in dominant RCA. Cardiac monitor revealed NSR with average HR 71 bpm, several atrial tachycardia episodes, occasional atrial bigeminy, rare PVCs, rare 2nd degree AV block type 1. Echo revealed mildly reduced LVEF at 50%, G1 DD, mild aortic root dilatation 43 mm, no significant valve disease. He was advised to follow-up in 3 months. ? ?Today, he is here alone for follow-up.  He reports he is feeling well, walking 3 miles, weight lifting several days per week.  Has occasional palpitations that are associated with SOB. Symptoms improve with sitting to rest for a few minutes. No chest pain, orthopnea, PND. Drinks 2 cups caffeine daily which is a decrease from last office visit.  ?Occasional lightheadedness upon standing which occurs a few times per month, no presyncope, or syncope.  Has a list of questions about recent  cardiac testing which were answered to his satisfaction ? ?Past Medical History:  ?Diagnosis Date  ? Anxiety   ? Essential hypertension   ? IBS (irritable bowel syndrome)   ? Mixed dyslipidemia   ? Obesity   ? Stress   ? Umbilical hernia   ? ? ?Past Surgical History:  ?Procedure Laterality Date  ? COLONOSCOPY    ? REPAIR OF DIGITAL NERVE    ? UMBILICAL HERNIA REPAIR N/A 03/30/2019  ? Procedure: UMBILICAL HERNIA REPAIR WITH MESH;  Surgeon: Jovita Kussmaul, MD;  Location: [facility];  Service: General;  Laterality: N/A;  ? ? ?Current Medications: ?Current Meds  ?Medication Sig  ? atorvastatin (LIPITOR) 80 MG tablet Take 1 tablet (80 mg total) by mouth daily.  ? diphenhydramine-acetaminophen (TYLENOL PM) 25-500 MG TABS tablet Take 1 tablet by mouth at bedtime as needed (sleep).  ? escitalopram (LEXAPRO) 10 MG tablet Take 10 mg by mouth daily.  ? losartan-hydrochlorothiazide (HYZAAR) 100-12.5 MG tablet Take 1 tablet by mouth daily.  ? MESALAMINE PO Take 800 mg by mouth daily.  ? metoprolol succinate (TOPROL XL) 25 MG 24 hr tablet Take 1 tablet (25 mg total) by mouth at bedtime.  ? Multiple Vitamin (MULTIVITAMIN) tablet Take 1 tablet by mouth daily.  ? OVER THE COUNTER MEDICATION Take 1 Dose by mouth daily. ( GABA CALM)  ?  ? ?Allergies:   Lipitor [atorvastatin] and Prednisone  ? ?Social History  ? ?Socioeconomic History  ? Marital status: Married  ?  Spouse name: Not on file  ?  Number of children: Not on file  ? Years of education: Not on file  ? Highest education level: Not on file  ?Occupational History  ? Not on file  ?Tobacco Use  ? Smoking status: Never  ? Smokeless tobacco: Never  ?Substance and Sexual Activity  ? Alcohol use: Yes  ?  Alcohol/week: 0.0 standard drinks  ?  Comment: social  ? Drug use: No  ? Sexual activity: Not on file  ?Other Topics Concern  ? Not on file  ?Social History Narrative  ? Not on file  ? ?Social Determinants of Health  ? ?Financial Resource Strain: Not on file  ?Food  Insecurity: Not on file  ?Transportation Needs: Not on file  ?Physical Activity: Not on file  ?Stress: Not on file  ?Social Connections: Not on file  ?  ? ?Family History: ?The patient's family history includes Diabetes in his sister; Hyperlipidemia in his mother; Hypertension in his father and mother. ? ?ROS:   ?Please see the history of present illness.    ?+ occasional palpitations ?+ occasional lightheadedness ?All other systems reviewed and are negative. ? ?Labs/Other Studies Reviewed:   ? ?The following studies were reviewed today: ? ?Cardiac monitor 02/12/22 ? ?Sinus rhythm average heart rate 71 bpm. ?Several atrial tachycardia episodes ?Occasional atrial bigeminy. Rare PVCs rare second-degree AV block type I ?Recommend continuing conservative management, caffeine reduction, good sleep hygiene, daily exercise ? ?2D Echo 01/11/22 ? ?1. Left ventricular ejection fraction, by estimation, is 50%. The left  ?ventricle has mildly decreased function. The left ventricle demonstrates  ?global hypokinesis. Left ventricular diastolic parameters are consistent  ?with Grade I diastolic dysfunction  ?(impaired relaxation).  ? 2. Right ventricular systolic function is normal. The right ventricular  ?size is normal. Tricuspid regurgitation signal is inadequate for assessing  ?PA pressure.  ? 3. The mitral valve is normal in structure. No evidence of mitral valve  ?regurgitation. No evidence of mitral stenosis.  ? 4. The aortic valve is tricuspid. There is mild calcification of the  ?aortic valve. Aortic valve regurgitation is not visualized. Aortic valve  ?sclerosis is present, with no evidence of aortic valve stenosis.  ? 5. Aortic dilatation noted. There is mild dilatation of the aortic root,  ?measuring 43 mm.  ? 6. The inferior vena cava is normal in size with greater than 50%  ?respiratory variability, suggesting right atrial pressure of 3 mmHg.  ? ?Coronary CTA 01/10/22 ? ?IMPRESSION: ?1. Coronary calcium score of 1.7. This  was 32nd percentile for age-, ?race-, and sex-matched controls. ?  ?2. Normal coronary origin with right dominance. ?  ?3.  Minimal (<25%) calcified plaque in the RCA.  CAD-RADS 1. ? ?Recent Labs: ?12/26/2021: BUN 11; Creatinine, Ser 1.11; Potassium 4.0; Sodium 139  ?Recent Lipid Panel ?No results found for: CHOL, TRIG, HDL, CHOLHDL, VLDL, LDLCALC, LDLDIRECT ? ? ?Risk Assessment/Calculations:   ?  ?   ? ?Physical Exam:   ? ?VS:  BP (!) 144/88   Pulse 75   Ht 5' 9.5" (1.765 m)   Wt 220 lb (99.8 kg)   SpO2 96%   BMI 32.02 kg/m?    ? ?Wt Readings from Last 3 Encounters:  ?03/26/22 220 lb (99.8 kg)  ?12/26/21 231 lb (104.8 kg)  ?04/10/20 217 lb (98.4 kg)  ?  ? ?GEN:  Well nourished, well developed in no acute distress ?HEENT: Normal ?NECK: No JVD; No carotid bruits ?CARDIAC: RRR, no murmurs, rubs, gallops ?RESPIRATORY:  Clear to auscultation without rales,  wheezing or rhonchi  ?ABDOMEN: Soft, non-tender, non-distended ?MUSCULOSKELETAL:  No edema; No deformity. 2+ pedal pulses, equalbilaterally ?SKIN: Warm and dry ?NEUROLOGIC:  Alert and oriented x 3 ?PSYCHIATRIC:  Normal affect  ? ?EKG:  EKG is not ordered today.  ? ?Diagnoses:   ? ?1. Mixed dyslipidemia   ?2. Palpitations   ?3. Dilated aortic root ([place])   ?4. Chest pain, unspecified type   ?5. Coronary artery calcification   ?6. Essential hypertension   ? ?Assessment and Plan:   ? ? ?Palpitations: Cardiac monitor revealed several atrial tachycardia episodes, occasional atrial bigeminy, rare PVCs, rare second-degree AV block type I.  He has reduced caffeine intake and is exercising regularly.  He continues to have symptoms of occasional palpitations.  We will start metoprolol succinate 25 mg at bedtime to see if symptoms improve. Will see him back in a few months to monitor HR.  ? ?Aortic root dilatation: 43 mm on echo 01/11/22, stable. Will repeat echo in 1 year.  ? ?Coronary calcification/Mixed dyslipidemia: Coronary CT 01/2022 revealed calcium score of 1.7, 32nd  percentile for age/sex matched controls. Minimal calcified plaque in dominant RCA. LDL 94 on 10/27/21. Statin intensity increased following coronary CT 01/2022.  He is due for repeat lipids in June.  ? ?Chest pain: No c

## 2022-03-26 ENCOUNTER — Ambulatory Visit: Payer: BC Managed Care – PPO | Admitting: Nurse Practitioner

## 2022-03-26 ENCOUNTER — Encounter: Payer: Self-pay | Admitting: Nurse Practitioner

## 2022-03-26 VITALS — BP 144/88 | HR 75 | Ht 69.5 in | Wt 220.0 lb

## 2022-03-26 DIAGNOSIS — I2584 Coronary atherosclerosis due to calcified coronary lesion: Secondary | ICD-10-CM

## 2022-03-26 DIAGNOSIS — I77810 Thoracic aortic ectasia: Secondary | ICD-10-CM

## 2022-03-26 DIAGNOSIS — R079 Chest pain, unspecified: Secondary | ICD-10-CM

## 2022-03-26 DIAGNOSIS — I2510 Atherosclerotic heart disease of native coronary artery without angina pectoris: Secondary | ICD-10-CM

## 2022-03-26 DIAGNOSIS — I10 Essential (primary) hypertension: Secondary | ICD-10-CM

## 2022-03-26 DIAGNOSIS — R002 Palpitations: Secondary | ICD-10-CM | POA: Diagnosis not present

## 2022-03-26 DIAGNOSIS — E782 Mixed hyperlipidemia: Secondary | ICD-10-CM | POA: Diagnosis not present

## 2022-03-26 MED ORDER — METOPROLOL SUCCINATE ER 25 MG PO TB24: 25.0000 mg | ORAL_TABLET | Freq: Every day | ORAL | 3 refills | Status: DC

## 2022-03-26 NOTE — Patient Instructions (Signed)
Medication Instructions:  ? ?START Toprol one (1) tablet by mouth ( 25 mg) daily in the evening.  ? ?*If you need a refill on your cardiac medications before your next appointment, please call your pharmacy* ? ? ?Lab Work: ? ?None ordered.  ? ?If you have labs (blood work) drawn today and your tests are completely normal, you will receive your results only by: ?MyChart Message (if you have MyChart) OR ?A paper copy in the mail ?If you have any lab test that is abnormal or we need to change your treatment, we will call you to review the results. ? ? ?Testing/Procedures: ? ? ?None ordered.  ? ? ?Follow-Up: ?At [facility], you and your health needs are our priority.  As part of our continuing mission to provide you with exceptional heart care, we have created designated Provider Care Teams.  These Care Teams include your primary Cardiologist (physician) and Advanced Practice Providers (APPs -  Physician Assistants and Nurse Practitioners) who all work together to provide you with the care you need, when you need it. ? ?We recommend signing up for the patient portal called "MyChart".  Sign up information is provided on this After Visit Summary.  MyChart is used to connect with patients for Virtual Visits (Telemedicine).  Patients are able to view lab/test results, encounter notes, upcoming appointments, etc.  Non-urgent messages can be sent to your provider as well.   ?To learn more about what you can do with MyChart, go to [email or web address].   ? ?Your next appointment:   ?2 month(s) ? ?The format for your next appointment:   ?In Person ? ?Provider:   ?Christen Bame, NP       ? ? ?Important Information About Sugar ? ? ? ? ?  ?

## 2022-04-19 ENCOUNTER — Other Ambulatory Visit: Payer: BC Managed Care – PPO

## 2022-04-19 DIAGNOSIS — Z79899 Other long term (current) drug therapy: Secondary | ICD-10-CM | POA: Diagnosis not present

## 2022-04-19 DIAGNOSIS — E782 Mixed hyperlipidemia: Secondary | ICD-10-CM | POA: Diagnosis not present

## 2022-04-19 LAB — LIPID PANEL
Chol/HDL Ratio: 4.4 ratio (ref 0.0–5.0)
Cholesterol, Total: 144 mg/dL (ref 100–199)
HDL: 33 mg/dL — ABNORMAL LOW (ref >39)
LDL Chol Calc (NIH): 86 mg/dL (ref 0–99)
Triglycerides: 141 mg/dL (ref 0–149)
VLDL Cholesterol Cal: 25 mg/dL (ref 5–40)

## 2022-04-20 ENCOUNTER — Other Ambulatory Visit: Payer: BC Managed Care – PPO

## 2022-04-23 ENCOUNTER — Other Ambulatory Visit: Payer: Self-pay

## 2022-04-23 MED ORDER — EZETIMIBE 10 MG PO TABS: 10.0000 mg | ORAL_TABLET | Freq: Every day | ORAL | 3 refills | Status: DC

## 2022-04-23 NOTE — Telephone Encounter (Signed)
Pt's medication was sent to pt's pharmacy as requested. Confirmation received.  °

## 2022-04-23 NOTE — Telephone Encounter (Signed)
Pt is requesting a refill on ezetimibe (Zetia). This medication is no longer on pt's medication list. Pt stated that Dr. Marlou Porch recommended this medication, but the medication was not added to pt's medication list. Please address

## 2022-05-24 NOTE — Progress Notes (Unsigned)
Cardiology Office Note:    Date:  05/28/2022   ID:  Brandon Watkins, DOB [birth year], MRN [id number]  PCP:  Vernie Shanks, MD (Inactive)   [facility] Providers Cardiologist:  Candee Furbish, MD     Referring MD: Vernie Shanks, MD   Chief Complaint: follow-up chest pain, palpitations  History of Present Illness:    Brandon Watkins is a very pleasant 61 y.o. male with a hx of hypertension, coronary calcium seen on CT, and palpitations.   Seen by Dr. Mare Ferrari in 2017 for left-sided chest discomfort.  Echocardiogram 03/2015 revealed LVEF 50-55%, no wma, and exercise Myoview was normal, no ischemia, excellent exercise tolerance.   He reestablished care 12/26/2021 with Dr. Marlou Porch for evaluation of palpitations, feeling his heart beat faster and harder at times. Noted at times when he would stand up he would feel chest fullness, at times could take a deep breath and symptoms improved.  Concerned due to strong family history, father had MI, father died of CHF, had a pacemaker. Coronary CTA revealed coronary calcium score of 1.7, 32nd percentile for race/sex matched controls, minimal (<25%) calcified plaque in dominant RCA. Cardiac monitor revealed NSR with average HR 71 bpm, several atrial tachycardia episodes, occasional atrial bigeminy, rare PVCs, rare 2nd degree AV block type 1. Echo revealed mildly reduced LVEF at 50%, G1 DD, mild aortic root dilatation 43 mm, no significant valve disease. He was advised to follow-up in 3 months.  He was last seen in our office by me on 03/26/22. Reported feeling well, walking 3 miles, weight lifting several days per week. Has occasional palpitations that are associated with SOB. Symptoms improve with sitting to rest for a few minutes. No chest pain, orthopnea, PND. Drinks 2 cups caffeine daily which is a decrease from last office visit.  Occasional lightheadedness upon standing which occurs a few times per month, no presyncope, or syncope.  Has a list of  questions about recent cardiac testing which were answered to his satisfaction. Metoprolol succinate 25 mg each evening was started for palpitations.  Today, he reports he is feeling well. He is uncertain whether or not he is taking metoprolol.  Reports palpitations are not particularly bothersome.  He wanted to make certain that they are not dangerous. I reviewed cardiac monitor and CCTA in detail. Is on Wegovy for weight loss, A1C has improved. Does not sleep great but is taking a natural supplement to help him sleep. Home BP well controlled. Walks and lift weights for exercise, eats a healthy diet. He denies chest pain, shortness of breath, lower extremity edema, fatigue, melena, hematuria, hemoptysis, diaphoresis, weakness, presyncope, syncope, orthopnea, and PND.  Past Medical History:  Diagnosis Date   Anxiety    Essential hypertension    IBS (irritable bowel syndrome)    Mixed dyslipidemia    Obesity    Stress    Umbilical hernia     Past Surgical History:  Procedure Laterality Date   COLONOSCOPY     REPAIR OF DIGITAL NERVE     UMBILICAL HERNIA REPAIR N/A 03/30/2019   Procedure: UMBILICAL HERNIA REPAIR WITH MESH;  Surgeon: Jovita Kussmaul, MD;  Location: [place];  Service: General;  Laterality: N/A;    Current Medications: Current Meds  Medication Sig   atorvastatin (LIPITOR) 80 MG tablet Take 1 tablet (80 mg total) by mouth daily.   diphenhydramine-acetaminophen (TYLENOL PM) 25-500 MG TABS tablet Take 1 tablet by mouth at bedtime as needed (sleep).   escitalopram (LEXAPRO)  10 MG tablet Take 10 mg by mouth daily.   ezetimibe (ZETIA) 10 MG tablet Take 1 tablet (10 mg total) by mouth daily.   losartan-hydrochlorothiazide (HYZAAR) 100-12.5 MG tablet Take 1 tablet by mouth daily.   MESALAMINE PO Take 100 mg by mouth daily.   Multiple Vitamin (MULTIVITAMIN) tablet Take 1 tablet by mouth daily.   OVER THE COUNTER MEDICATION Take 1 Dose by mouth daily. ( GABA CALM)    Semaglutide-Weight Management (WEGOVY) 2.4 MG/0.75ML SOAJ once a week.     Allergies:   Lipitor [atorvastatin] and Prednisone   Social History   Socioeconomic History   Marital status: Married    Spouse name: Not on file   Number of children: Not on file   Years of education: Not on file   Highest education level: Not on file  Occupational History   Not on file  Tobacco Use   Smoking status: Never   Smokeless tobacco: Never  Substance and Sexual Activity   Alcohol use: Yes    Alcohol/week: 0.0 standard drinks of alcohol    Comment: social   Drug use: No   Sexual activity: Not on file  Other Topics Concern   Not on file  Social History Narrative   Not on file   Social Determinants of Health   Financial Resource Strain: Not on file  Food Insecurity: Not on file  Transportation Needs: Not on file  Physical Activity: Not on file  Stress: Not on file  Social Connections: Not on file     Family History: The patient's family history includes Diabetes in his sister; Hyperlipidemia in his mother; Hypertension in his father and mother.  ROS:   Please see the history of present illness.    + occasional palpitations All other systems reviewed and are negative.  Labs/Other Studies Reviewed:    The following studies were reviewed today:  Cardiac monitor 02/12/22  Sinus rhythm average heart rate 71 bpm. Several atrial tachycardia episodes Occasional atrial bigeminy. Rare PVCs rare second-degree AV block type I Recommend continuing conservative management, caffeine reduction, good sleep hygiene, daily exercise  2D Echo 01/11/22  1. Left ventricular ejection fraction, by estimation, is 50%. The left  ventricle has mildly decreased function. The left ventricle demonstrates  global hypokinesis. Left ventricular diastolic parameters are consistent  with Grade I diastolic dysfunction  (impaired relaxation).   2. Right ventricular systolic function is normal. The right  ventricular  size is normal. Tricuspid regurgitation signal is inadequate for assessing  PA pressure.   3. The mitral valve is normal in structure. No evidence of mitral valve  regurgitation. No evidence of mitral stenosis.   4. The aortic valve is tricuspid. There is mild calcification of the  aortic valve. Aortic valve regurgitation is not visualized. Aortic valve  sclerosis is present, with no evidence of aortic valve stenosis.   5. Aortic dilatation noted. There is mild dilatation of the aortic root,  measuring 43 mm.   6. The inferior vena cava is normal in size with greater than 50%  respiratory variability, suggesting right atrial pressure of 3 mmHg.   Coronary CTA 01/10/22  IMPRESSION: 1. Coronary calcium score of 1.7. This was 32nd percentile for age-, race-, and sex-matched controls.   2. Normal coronary origin with right dominance.   3.  Minimal (<25%) calcified plaque in the RCA.  CAD-RADS 1.  Recent Labs: 12/26/2021: BUN 11; Creatinine, Ser 1.11; Potassium 4.0; Sodium 139  Recent Lipid Panel    Component  Value Date/Time   CHOL 144 04/19/2022 0847   TRIG 141 04/19/2022 0847   HDL 33 (L) 04/19/2022 0847   CHOLHDL 4.4 04/19/2022 0847   LDLCALC 86 04/19/2022 0847     Risk Assessment/Calculations:         Physical Exam:    VS:  BP 128/70   Pulse 65   Ht 5' 9.5" (1.765 m)   Wt 215 lb (97.5 kg)   SpO2 97%   BMI 31.29 kg/m     Wt Readings from Last 3 Encounters:  05/28/22 215 lb (97.5 kg)  03/26/22 220 lb (99.8 kg)  12/26/21 231 lb (104.8 kg)     GEN:  Well nourished, well developed in no acute distress HEENT: Normal NECK: No JVD; No carotid bruits CARDIAC: RRR, no murmurs, rubs, gallops RESPIRATORY:  Clear to auscultation without rales, wheezing or rhonchi  ABDOMEN: Soft, non-tender, non-distended MUSCULOSKELETAL:  No edema; No deformity. 2+ pedal pulses, equalbilaterally SKIN: Warm and dry NEUROLOGIC:  Alert and oriented x 3 PSYCHIATRIC:  Normal  affect   EKG:  EKG is not ordered today  Diagnoses:    1. Palpitations   2. Dilated aortic root ([place])   3. Essential hypertension   4. Coronary artery calcification   5. Hyperlipidemia LDL goal <70     Assessment and Plan:     Palpitations: He was advised to start metoprolol succinate 25 mg each evening for atrial tachycardia, occasional atrial bigeminy, rare PVCs, seen on cardiac monitor 02/2022. Occasional palpitations that do not interfere with activity. Is not certain that he started metoprolol, will check at home. I again reviewed the indication for the beta-blocker. Advised him to report back any concerns prior to next office visit.    Aortic root dilatation: 43 mm on echo 01/11/22, stable. Plan for echo 01/2023 followed by office visit.    Coronary calcification/Mixed dyslipidemia: Coronary CT 01/2022 revealed calcium score of 1.7, 32nd percentile for age/sex matched controls. Minimal calcified plaque in dominant RCA. Statin intensity increased following coronary CT 01/2022.  Repeat lipid profile 04/19/22: LDL 86, HDL 33.  Recommended 150 minutes moderate intensity exercise each week, heart healthy diet low in saturated fat in addition to atorvastatin and Zetia.  Essential hypertension: BP is well-controlled today. Advised that if he chooses to start Toprol XL it will not significantly impact BP. Continue Hyzaar.   Disposition: 8 months with Dr. Marlou Porch with echo prior     Medication Adjustments/Labs and Tests Ordered: Current medicines are reviewed at length with the patient today.  Concerns regarding medicines are outlined above.  No orders of the defined types were placed in this encounter.  No orders of the defined types were placed in this encounter.   Patient Instructions  Medication Instructions:   Your physician recommends that you continue on your current medications as directed. Please refer to the Current Medication list given to you today.   *If you need a refill on  your cardiac medications before your next appointment, please call your pharmacy*   Lab Work:  None ordered.  If you have labs (blood work) drawn today and your tests are completely normal, you will receive your results only by: [place] (if you have MyChart) OR A paper copy in the mail If you have any lab test that is abnormal or we need to change your treatment, we will call you to review the results.   Testing/Procedures:  Your physician has requested that you have an echocardiogram. Echocardiography is a painless  test that uses sound waves to create images of your heart. It provides your doctor with information about the size and shape of your heart and how well your heart's chambers and valves are working. This procedure takes approximately one hour. There are no restrictions for this procedure.    Follow-Up: At [facility], you and your health needs are our priority.  As part of our continuing mission to provide you with exceptional heart care, we have created designated Provider Care Teams.  These Care Teams include your primary Cardiologist (physician) and Advanced Practice Providers (APPs -  Physician Assistants and Nurse Practitioners) who all work together to provide you with the care you need, when you need it.  We recommend signing up for the patient portal called "MyChart".  Sign up information is provided on this After Visit Summary.  MyChart is used to connect with patients for Virtual Visits (Telemedicine).  Patients are able to view lab/test results, encounter notes, upcoming appointments, etc.  Non-urgent messages can be sent to your provider as well.   To learn more about what you can do with MyChart, go to [email or web address].    Your next appointment:   8 month(s)  The format for your next appointment:   In Person  Provider:   Candee Furbish, MD     Other Instructions  Your physician wants you to follow-up in: 8 months with Dr. Marlou Porch. You will  receive a reminder letter in the mail two months in advance. If you don't receive a letter, please call our office to schedule the follow-up appointment.   Important Information About Sugar         Signed, Emmaline Life, NP  05/28/2022 12:42 PM    [facility]

## 2022-05-28 ENCOUNTER — Ambulatory Visit: Payer: BC Managed Care – PPO | Admitting: Nurse Practitioner

## 2022-05-28 ENCOUNTER — Encounter: Payer: Self-pay | Admitting: Nurse Practitioner

## 2022-05-28 VITALS — BP 128/70 | HR 65 | Ht 69.5 in | Wt 215.0 lb

## 2022-05-28 DIAGNOSIS — I2584 Coronary atherosclerosis due to calcified coronary lesion: Secondary | ICD-10-CM

## 2022-05-28 DIAGNOSIS — I77810 Thoracic aortic ectasia: Secondary | ICD-10-CM | POA: Diagnosis not present

## 2022-05-28 DIAGNOSIS — I10 Essential (primary) hypertension: Secondary | ICD-10-CM

## 2022-05-28 DIAGNOSIS — I2510 Atherosclerotic heart disease of native coronary artery without angina pectoris: Secondary | ICD-10-CM

## 2022-05-28 DIAGNOSIS — R002 Palpitations: Secondary | ICD-10-CM

## 2022-05-28 DIAGNOSIS — E785 Hyperlipidemia, unspecified: Secondary | ICD-10-CM

## 2022-05-28 NOTE — Patient Instructions (Signed)
Medication Instructions:   Your physician recommends that you continue on your current medications as directed. Please refer to the Current Medication list given to you today.   *If you need a refill on your cardiac medications before your next appointment, please call your pharmacy*   Lab Work:  None ordered.  If you have labs (blood work) drawn today and your tests are completely normal, you will receive your results only by: [place] (if you have MyChart) OR A paper copy in the mail If you have any lab test that is abnormal or we need to change your treatment, we will call you to review the results.   Testing/Procedures:  Your physician has requested that you have an echocardiogram. Echocardiography is a painless test that uses sound waves to create images of your heart. It provides your doctor with information about the size and shape of your heart and how well your heart's chambers and valves are working. This procedure takes approximately one hour. There are no restrictions for this procedure.    Follow-Up: At [facility], you and your health needs are our priority.  As part of our continuing mission to provide you with exceptional heart care, we have created designated Provider Care Teams.  These Care Teams include your primary Cardiologist (physician) and Advanced Practice Providers (APPs -  Physician Assistants and Nurse Practitioners) who all work together to provide you with the care you need, when you need it.  We recommend signing up for the patient portal called "MyChart".  Sign up information is provided on this After Visit Summary.  MyChart is used to connect with patients for Virtual Visits (Telemedicine).  Patients are able to view lab/test results, encounter notes, upcoming appointments, etc.  Non-urgent messages can be sent to your provider as well.   To learn more about what you can do with MyChart, go to [email or web address].    Your next appointment:    8 month(s)  The format for your next appointment:   In Person  Provider:   Candee Furbish, MD     Other Instructions  Your physician wants you to follow-up in: 8 months with Dr. Marlou Porch. You will receive a reminder letter in the mail two months in advance. If you don't receive a letter, please call our office to schedule the follow-up appointment.   Important Information About Sugar

## 2022-06-18 DIAGNOSIS — Z6831 Body mass index (BMI) 31.0-31.9, adult: Secondary | ICD-10-CM | POA: Diagnosis not present

## 2022-06-18 DIAGNOSIS — R739 Hyperglycemia, unspecified: Secondary | ICD-10-CM | POA: Diagnosis not present

## 2022-06-18 DIAGNOSIS — I10 Essential (primary) hypertension: Secondary | ICD-10-CM | POA: Diagnosis not present

## 2022-06-18 DIAGNOSIS — E669 Obesity, unspecified: Secondary | ICD-10-CM | POA: Diagnosis not present

## 2023-01-09 DIAGNOSIS — F411 Generalized anxiety disorder: Secondary | ICD-10-CM | POA: Diagnosis not present

## 2023-01-09 DIAGNOSIS — I10 Essential (primary) hypertension: Secondary | ICD-10-CM | POA: Diagnosis not present

## 2023-01-09 DIAGNOSIS — Z125 Encounter for screening for malignant neoplasm of prostate: Secondary | ICD-10-CM | POA: Diagnosis not present

## 2023-01-09 DIAGNOSIS — R739 Hyperglycemia, unspecified: Secondary | ICD-10-CM | POA: Diagnosis not present

## 2023-01-09 DIAGNOSIS — E785 Hyperlipidemia, unspecified: Secondary | ICD-10-CM | POA: Diagnosis not present

## 2023-01-09 DIAGNOSIS — Z0000 Encounter for general adult medical examination without abnormal findings: Secondary | ICD-10-CM | POA: Diagnosis not present

## 2023-01-16 ENCOUNTER — Other Ambulatory Visit: Payer: Self-pay

## 2023-01-16 MED ORDER — ATORVASTATIN CALCIUM 80 MG PO TABS: 80.0000 mg | ORAL_TABLET | Freq: Every day | ORAL | 1 refills | Status: DC

## 2023-01-17 ENCOUNTER — Other Ambulatory Visit (HOSPITAL_COMMUNITY): Payer: BC Managed Care – PPO

## 2023-01-22 DIAGNOSIS — M79641 Pain in right hand: Secondary | ICD-10-CM | POA: Diagnosis not present

## 2023-01-22 DIAGNOSIS — M79642 Pain in left hand: Secondary | ICD-10-CM | POA: Diagnosis not present

## 2023-03-27 ENCOUNTER — Other Ambulatory Visit: Payer: Self-pay | Admitting: Nurse Practitioner

## 2023-04-18 ENCOUNTER — Other Ambulatory Visit: Payer: Self-pay

## 2023-04-18 MED ORDER — EZETIMIBE 10 MG PO TABS: 10.0000 mg | ORAL_TABLET | Freq: Every day | ORAL | 0 refills | Status: DC

## 2023-04-27 ENCOUNTER — Other Ambulatory Visit: Payer: Self-pay | Admitting: Nurse Practitioner

## 2023-05-27 ENCOUNTER — Other Ambulatory Visit: Payer: Self-pay | Admitting: Nurse Practitioner

## 2023-06-24 ENCOUNTER — Other Ambulatory Visit: Payer: Self-pay

## 2023-06-24 MED ORDER — METOPROLOL SUCCINATE ER 25 MG PO TB24: 25.0000 mg | ORAL_TABLET | Freq: Every day | ORAL | 0 refills | Status: DC

## 2023-07-08 ENCOUNTER — Other Ambulatory Visit: Payer: Self-pay | Admitting: *Deleted

## 2023-07-08 MED ORDER — METOPROLOL SUCCINATE ER 25 MG PO TB24: 25.0000 mg | ORAL_TABLET | Freq: Every day | ORAL | 0 refills | Status: AC

## 2023-07-17 DIAGNOSIS — E785 Hyperlipidemia, unspecified: Secondary | ICD-10-CM | POA: Diagnosis not present

## 2023-07-17 DIAGNOSIS — F411 Generalized anxiety disorder: Secondary | ICD-10-CM | POA: Diagnosis not present

## 2023-07-17 DIAGNOSIS — R7303 Prediabetes: Secondary | ICD-10-CM | POA: Diagnosis not present

## 2023-07-17 DIAGNOSIS — I10 Essential (primary) hypertension: Secondary | ICD-10-CM | POA: Diagnosis not present

## 2023-07-19 ENCOUNTER — Other Ambulatory Visit: Payer: Self-pay | Admitting: Cardiology

## 2023-07-19 MED ORDER — EZETIMIBE 10 MG PO TABS: 10.0000 mg | ORAL_TABLET | Freq: Every day | ORAL | 0 refills | Status: DC

## 2023-07-29 ENCOUNTER — Other Ambulatory Visit: Payer: Self-pay

## 2023-08-02 ENCOUNTER — Other Ambulatory Visit: Payer: Self-pay | Admitting: Cardiology

## 2023-08-08 DIAGNOSIS — K648 Other hemorrhoids: Secondary | ICD-10-CM | POA: Diagnosis not present

## 2023-08-08 DIAGNOSIS — K5289 Other specified noninfective gastroenteritis and colitis: Secondary | ICD-10-CM | POA: Diagnosis not present

## 2023-08-08 DIAGNOSIS — K626 Ulcer of anus and rectum: Secondary | ICD-10-CM | POA: Diagnosis not present

## 2023-08-08 DIAGNOSIS — K633 Ulcer of intestine: Secondary | ICD-10-CM | POA: Diagnosis not present

## 2023-08-08 DIAGNOSIS — K5100 Ulcerative (chronic) pancolitis without complications: Secondary | ICD-10-CM | POA: Diagnosis not present

## 2023-08-20 ENCOUNTER — Other Ambulatory Visit: Payer: Self-pay

## 2023-08-20 MED ORDER — ATORVASTATIN CALCIUM 80 MG PO TABS: 80.0000 mg | ORAL_TABLET | Freq: Every day | ORAL | 2 refills | Status: DC

## 2023-10-29 NOTE — Progress Notes (Signed)
Cardiology Office Note:  .   Date:  11/01/2023  ID:  Brandon Watkins, DOB [birth year], MRN [id number] PCP: Soundra Pilon, FNP  [facility] Providers Cardiologist:  Donato Schultz, MD    Patient Profile: .      PMH Hypertension Coronary calcification on CT Coronary CTA 01/10/22  CAC score 1.7 (32nd percentile) Minimal calcified plaque (<25%) in RCA Palpitations Aortic root dilatation  Seen by Dr. Patty Sermons in 2017 for left-sided chest discomfort.  Echocardiogram 03/2015 revealed LVEF 50 to 55%, no wma, and exercise Myoview was normal, no ischemia, excellent exercise tolerance.  He reestablished care 12/26/2021 with Dr. Anne Fu for evaluation of palpitations, feeling his heart beat faster and harder at times.  Noted at times when he would stand up he would feel chest fullness which would improve at times with taking a deep breath.  Concerned due to strong family history, father had MI and died of CHF, also had a pacemaker.Coronary CTA revealed coronary calcium score of 1.7, 32nd percentile, minimal (<25%) calcified plaque in dominant RCA.  Cardiac monitor revealed NSR with average HR 71 bpm, several episodes of atrial tachycardia, occasional atrial bigeminy, rare PVCs, rare second-degree AV block type I.  Echo revealed mildly reduced LVEF at 50%, G1 DD, mild aortic root dilatation at 43 mm, no significant valve disease.  At follow-up, he reported he was drinking 2 cups of coffee daily which was decreased from time of wearing the monitor.  He continued to have occasional palpitations associated with shortness of breath.  Symptoms improved with sitting for a few minutes.  He was walking 3 miles and weightlifting several days a week.  Metoprolol succinate 25 mg each evening had been started for palpitations.   Last cardiology clinic visit was 05/28/2022 with me.  He was not sure whether he was taking metoprolol.  Reported palpitations not particularly bothersome.  He was on [facility] for weight loss  and A1c had improved.  Reported BP well-controlled.  He continued to walk and lift weights for exercise as well as eat a healthy diet.  I reviewed indication for metoprolol.  Plan for echo 01/2023 for aortic root dilatation.  LDL on 04/19/2022 was 86.  He was advised to continue atorvastatin 80 mg daily along with ezetimibe 10 mg daily and have repeat lipid panel in 2 months.       History of Present Illness: Marland Kitchen   Brandon Watkins is a very pleasant 62 y.o. male who is here today for overdue follow-up. He reports he is feeling well. He maintains an active lifestyle, working out three to four times a week, which includes walking two to three miles and weight lifting. He follows a balanced diet, focusing on lean proteins and complex carbohydrates. He expresses concern about the number of medications he is taking. He also mentions a recent colonoscopy, during which the anesthesiologist reported difficulty detecting a pulse in his left hand. He asks if we received a note about this which I was not able to locate.  He denies chest pain, shortness of breath, orthopnea, PND, edema, palpitations, presyncope, syncope.  Lengthy discussion about management of aortic dilatation and about right bundle branch block seen on EKG.    Discussed the use of AI scribe software for clinical note transcription with the patient, who gave verbal consent to proceed.   ROS: See HPI       Studies Reviewed: Marland Kitchen   EKG Interpretation Date/Time:  Friday November 01 2023 08:03:17 EST Ventricular Rate:  63 PR  Interval:  190 QRS Duration:  102 QT Interval:  424 QTC Calculation: 433 R Axis:   26  Text Interpretation: Normal sinus rhythm with sinus arrhythmia Incomplete right bundle branch block When compared with ECG of 26-Mar-2019 10:46, No significant change was found No acute changes Confirmed by Eligha Bridegroom [id number]) on 11/01/2023 8:19:25 AM    Risk Assessment/Calculations:             Physical Exam:   VS:  BP 108/66 (BP  Location: Left Arm, Patient Position: Sitting, Cuff Size: Normal)   Pulse 63   Ht 5' 9.5" (1.765 m)   Wt 217 lb (98.4 kg)   SpO2 94%   BMI 31.59 kg/m    Wt Readings from Last 3 Encounters:  11/01/23 217 lb (98.4 kg)  05/28/22 215 lb (97.5 kg)  03/26/22 220 lb (99.8 kg)    GEN: Well nourished, well developed in no acute distress NECK: No JVD; No carotid bruits CARDIAC: RRR, no murmurs, rubs, gallops RESPIRATORY:  Clear to auscultation without rales, wheezing or rhonchi  ABDOMEN: Soft, non-tender, non-distended EXTREMITIES:  No edema; No deformity     ASSESSMENT AND PLAN: .    CAD without angina: Coronary CT completed 01/2022 revealed mildly elevated CAC score 1.7, minimal CAD (< 25%) in RCA. He denies chest pain, dyspnea, or other symptoms concerning for angina.  No indication for further ischemic evaluation at this time. LDL is well controlled. Focus on secondary prevention including heart healthy mostly plant based diet avoiding saturated fat, processed foods, simple carbohydrates, and sugar along with aiming for at least 150 minutes of moderate intensity exercise each week. Continue metoprolol, atorvastatin, losartan, ezetimibe.   Aortic dilatation: Coronary CT 01/10/22 revealed ascending aortic dilatation 4.0 cm. Echo 01/11/22 revealed mild dilatation of aortic root 43 mm. Lengthy discussion about surveillance of aortic size.  We will get CTA aorta for further evaluation.  If this is cost prohibitive, I have explained that we can monitor through echo as well. I discussed the difference between the 2 modalities. No symptoms of aortic rupture. Precautions advised.   Right bundle branch block: EKG reveals incomplete RBBB which was also noted on previous EKGs from 2023 and 2020. Lengthy discussion regarding this finding. He is asymptomatic.  We will update echocardiogram for evaluation of heart and valve function.  Chronic HFmrEF/Diastolic dysfunction: Echo 01/11/2022 revealed mildly reduced LVEF  50%, global hypokinesis, grade 1 diastolic dysfunction, normal RV. No evidence of volume overload on exam. He denies shortness of breath, edema, orthopnea, PND. We will repeat echocardiogram to evaluate heart function. Continue losartan-hydrochlorothiazide and metoprolol.   Palpitations: Quiescent at this time. Continue metoprolol.   Decreased pulse left hand: He reports anesthesiologist was concerned about decreased pulse in left hand during colonoscopy. No notes to review. Reassurance provided that he has equal 2+ bilateral pulses, appropriate skin color and temperature. Blood pressures are appropriate in both arms.  Hypertension: BP is well controlled. He is concerned about taking too many medications. We discussed importance of goal BP 130/80 or lower. Continue losartan-hydrochlorothiazide and metoprolol. Continue regular exercise and low sodium diet.   Hyperlipidemia LDL goal < 70: Lipid panel completed 07/17/23 with total cholesterol 119, HDL 34, LDL 66, and triglycerides 604. Continue atorvastatin.       Dispo: 1 year with Dr. Anne Fu  Signed, Eligha Bridegroom, NP-C

## 2023-11-01 ENCOUNTER — Ambulatory Visit: Payer: BC Managed Care – PPO | Attending: Nurse Practitioner | Admitting: Nurse Practitioner

## 2023-11-01 ENCOUNTER — Encounter: Payer: Self-pay | Admitting: Nurse Practitioner

## 2023-11-01 VITALS — BP 108/66 | HR 63 | Ht 69.5 in | Wt 217.0 lb

## 2023-11-01 DIAGNOSIS — I10 Essential (primary) hypertension: Secondary | ICD-10-CM

## 2023-11-01 DIAGNOSIS — I5022 Chronic systolic (congestive) heart failure: Secondary | ICD-10-CM

## 2023-11-01 DIAGNOSIS — I4510 Unspecified right bundle-branch block: Secondary | ICD-10-CM

## 2023-11-01 DIAGNOSIS — I5189 Other ill-defined heart diseases: Secondary | ICD-10-CM

## 2023-11-01 DIAGNOSIS — I77810 Thoracic aortic ectasia: Secondary | ICD-10-CM | POA: Diagnosis not present

## 2023-11-01 DIAGNOSIS — I2510 Atherosclerotic heart disease of native coronary artery without angina pectoris: Secondary | ICD-10-CM | POA: Diagnosis not present

## 2023-11-01 DIAGNOSIS — R002 Palpitations: Secondary | ICD-10-CM | POA: Diagnosis not present

## 2023-11-01 DIAGNOSIS — E785 Hyperlipidemia, unspecified: Secondary | ICD-10-CM | POA: Diagnosis not present

## 2023-11-01 DIAGNOSIS — R0989 Other specified symptoms and signs involving the circulatory and respiratory systems: Secondary | ICD-10-CM

## 2023-11-01 NOTE — Patient Instructions (Signed)
Medication Instructions:   Your physician recommends that you continue on your current medications as directed. Please refer to the Current Medication list given to you today.   *If you need a refill on your cardiac medications before your next appointment, please call your pharmacy*   Lab Work:  None ordered.  If you have labs (blood work) drawn today and your tests are completely normal, you will receive your results only by: MyChart Message (if you have MyChart) OR A paper copy in the mail If you have any lab test that is abnormal or we need to change your treatment, we will call you to review the results.   Testing/Procedures:  Your physician has requested that you have an echocardiogram. Echocardiography is a painless test that uses sound waves to create images of your heart. It provides your doctor with information about the size and shape of your heart and how well your heart's chambers and valves are working. This procedure takes approximately one hour. There are no restrictions for this procedure. Please do NOT wear cologne, aftershave, or lotions (deodorant is allowed). Please arrive 15 minutes prior to your appointment time.  Please note: We ask at that you not bring children with you during ultrasound (echo/ vascular) testing. Due to room size and safety concerns, children are not allowed in the ultrasound rooms during exams. Our front office staff cannot provide observation of children in our lobby area while testing is being conducted. An adult accompanying a patient to their appointment will only be allowed in the ultrasound room at the discretion of the ultrasound technician under special circumstances. We apologize for any inconvenience.    Follow-Up: At [facility], you and your health needs are our priority.  As part of our continuing mission to provide you with exceptional heart care, we have created designated Provider Care Teams.  These Care Teams include  your primary Cardiologist (physician) and Advanced Practice Providers (APPs -  Physician Assistants and Nurse Practitioners) who all work together to provide you with the care you need, when you need it.  We recommend signing up for the patient portal called "MyChart".  Sign up information is provided on this After Visit Summary.  MyChart is used to connect with patients for Virtual Visits (Telemedicine).  Patients are able to view lab/test results, encounter notes, upcoming appointments, etc.  Non-urgent messages can be sent to your provider as well.   To learn more about what you can do with MyChart, go to [email or web address].    Your next appointment:   1 year(s)  Provider:   Donato Schultz, MD  or Eligha Bridegroom, NP         Other Instructions  Your physician wants you to follow-up in: 1 year.  You will receive a reminder letter in the mail two months in advance. If you don't receive a letter, please call our office to schedule the follow-up appointment.   One of your tests has shown an aneurysm . The word "aneurysm" refers to a bulge in an artery (blood vessel). Most people think of them in the context of an emergency, but yours was found incidentally. At this point there is nothing you need to do from a procedure standpoint, but there are some important things to keep in mind for day-to-day life.  Mainstays of therapy for aneurysms include very good blood pressure control, healthy lifestyle, and avoiding tobacco products and street drugs. Research has raised concern that antibiotics in the fluoroquinolone class could be associated  with increased risk of having an aneurysm develop or tear. This includes medicines that end in "floxacin," like Cipro or Levaquin. Make sure to discuss this information with other healthcare providers if you require antibiotics.  Since aneurysms can run in families, you should discuss your diagnosis with first degree relatives as they may need to be screened for  this. Regular mild-moderate physical exercise is important, but avoid heavy lifting/weight lifting over 30lbs, chopping wood, shoveling snow or digging heavy earth with a shovel. It is best to avoid activities that cause grunting or straining (medically referred to as a "Valsalva maneuver"). This happens when a person bears down against a closed throat to increase the strength of arm or abdominal muscles. There's often a tendency to do this when lifting heavy weights, doing sit-ups, push-ups or chin-ups, etc., but it may be harmful.  This is a finding I would expect to be monitored periodically by your cardiology team. Most unruptured thoracic aortic aneurysms cause no symptoms, so they are often found during exams for other conditions. Contact a health care provider if you develop any discomfort in your upper back, neck, abdomen, trouble swallowing, cough or hoarseness, or unexplained weight loss. Get help right away if you develop severe pain in your upper back or abdomen that may move into your chest and arms, or any other concerning symptoms such as shortness of breath or fever.

## 2023-11-15 ENCOUNTER — Other Ambulatory Visit: Payer: Self-pay

## 2023-11-15 MED ORDER — ATORVASTATIN CALCIUM 80 MG PO TABS: 80.0000 mg | ORAL_TABLET | Freq: Every day | ORAL | 3 refills | Status: DC

## 2023-11-18 ENCOUNTER — Ambulatory Visit (HOSPITAL_COMMUNITY)
Admission: RE | Admit: 2023-11-18 | Discharge: 2023-11-18 | Disposition: A | Discharge status: Home / Self Care | Payer: BC Managed Care – PPO | Source: Ambulatory Visit | Attending: Nurse Practitioner | Admitting: Nurse Practitioner

## 2023-11-18 DIAGNOSIS — I4510 Unspecified right bundle-branch block: Secondary | ICD-10-CM | POA: Insufficient documentation

## 2023-11-18 DIAGNOSIS — I77810 Thoracic aortic ectasia: Secondary | ICD-10-CM | POA: Insufficient documentation

## 2023-11-18 DIAGNOSIS — E785 Hyperlipidemia, unspecified: Secondary | ICD-10-CM | POA: Insufficient documentation

## 2023-11-18 DIAGNOSIS — R002 Palpitations: Secondary | ICD-10-CM | POA: Diagnosis not present

## 2023-11-18 DIAGNOSIS — I2510 Atherosclerotic heart disease of native coronary artery without angina pectoris: Secondary | ICD-10-CM | POA: Diagnosis not present

## 2023-11-18 DIAGNOSIS — I700 Atherosclerosis of aorta: Secondary | ICD-10-CM | POA: Diagnosis not present

## 2023-11-18 MED ORDER — IOHEXOL 350 MG/ML SOLN
75.0000 mL | Freq: Once | INTRAVENOUS | Status: AC | PRN
  Administered 2023-11-18: 75 mL via INTRAVENOUS

## 2023-11-19 ENCOUNTER — Other Ambulatory Visit: Payer: Self-pay | Admitting: Nurse Practitioner

## 2023-11-19 DIAGNOSIS — E785 Hyperlipidemia, unspecified: Secondary | ICD-10-CM

## 2023-11-19 DIAGNOSIS — I2510 Atherosclerotic heart disease of native coronary artery without angina pectoris: Secondary | ICD-10-CM

## 2023-12-04 ENCOUNTER — Ambulatory Visit (HOSPITAL_COMMUNITY): Payer: BC Managed Care – PPO | Attending: Internal Medicine

## 2023-12-04 DIAGNOSIS — I2510 Atherosclerotic heart disease of native coronary artery without angina pectoris: Secondary | ICD-10-CM | POA: Diagnosis not present

## 2023-12-04 DIAGNOSIS — I77810 Thoracic aortic ectasia: Secondary | ICD-10-CM | POA: Insufficient documentation

## 2023-12-04 DIAGNOSIS — I4510 Unspecified right bundle-branch block: Secondary | ICD-10-CM | POA: Insufficient documentation

## 2023-12-04 DIAGNOSIS — R002 Palpitations: Secondary | ICD-10-CM | POA: Diagnosis not present

## 2023-12-04 DIAGNOSIS — E785 Hyperlipidemia, unspecified: Secondary | ICD-10-CM | POA: Insufficient documentation

## 2023-12-04 LAB — ECHOCARDIOGRAM COMPLETE
Area-P 1/2: 3.18 cm2
S' Lateral: 3 cm

## 2024-02-04 DIAGNOSIS — F411 Generalized anxiety disorder: Secondary | ICD-10-CM | POA: Diagnosis not present

## 2024-02-04 DIAGNOSIS — R739 Hyperglycemia, unspecified: Secondary | ICD-10-CM | POA: Diagnosis not present

## 2024-02-04 DIAGNOSIS — Z125 Encounter for screening for malignant neoplasm of prostate: Secondary | ICD-10-CM | POA: Diagnosis not present

## 2024-02-04 DIAGNOSIS — E785 Hyperlipidemia, unspecified: Secondary | ICD-10-CM | POA: Diagnosis not present

## 2024-02-04 DIAGNOSIS — E669 Obesity, unspecified: Secondary | ICD-10-CM | POA: Diagnosis not present

## 2024-02-04 DIAGNOSIS — Z0000 Encounter for general adult medical examination without abnormal findings: Secondary | ICD-10-CM | POA: Diagnosis not present

## 2024-02-04 DIAGNOSIS — I10 Essential (primary) hypertension: Secondary | ICD-10-CM | POA: Diagnosis not present

## 2024-02-04 DIAGNOSIS — Z23 Encounter for immunization: Secondary | ICD-10-CM | POA: Diagnosis not present

## 2024-07-17 DIAGNOSIS — E785 Hyperlipidemia, unspecified: Secondary | ICD-10-CM | POA: Diagnosis not present

## 2024-07-17 DIAGNOSIS — R7303 Prediabetes: Secondary | ICD-10-CM | POA: Diagnosis not present

## 2024-07-17 DIAGNOSIS — I10 Essential (primary) hypertension: Secondary | ICD-10-CM | POA: Diagnosis not present

## 2024-07-17 DIAGNOSIS — E669 Obesity, unspecified: Secondary | ICD-10-CM | POA: Diagnosis not present

## 2024-07-17 DIAGNOSIS — F411 Generalized anxiety disorder: Secondary | ICD-10-CM | POA: Diagnosis not present

## 2024-07-20 ENCOUNTER — Other Ambulatory Visit: Payer: Self-pay

## 2024-07-22 MED ORDER — EZETIMIBE 10 MG PO TABS: 10.0000 mg | ORAL_TABLET | Freq: Every day | ORAL | 0 refills | Status: DC

## 2024-09-17 DIAGNOSIS — M180 Bilateral primary osteoarthritis of first carpometacarpal joints: Secondary | ICD-10-CM | POA: Diagnosis not present

## 2024-10-21 ENCOUNTER — Other Ambulatory Visit: Payer: Self-pay | Admitting: Cardiology

## 2024-10-28 DIAGNOSIS — M170 Bilateral primary osteoarthritis of knee: Secondary | ICD-10-CM | POA: Diagnosis not present

## 2024-11-13 ENCOUNTER — Other Ambulatory Visit: Payer: Self-pay | Admitting: Cardiology

## 2024-11-18 ENCOUNTER — Other Ambulatory Visit: Payer: Self-pay | Admitting: Cardiology

## 2024-11-27 ENCOUNTER — Other Ambulatory Visit: Payer: Self-pay | Admitting: Cardiology

## 2024-11-30 NOTE — Telephone Encounter (Signed)
 In accordance with refill protocols, please review and address the following requirements before this medication refill can be authorized:  Labs

## 2024-12-01 NOTE — Telephone Encounter (Signed)
 Please obtain from PCP - not seen here since 12/24

## 2024-12-06 ENCOUNTER — Other Ambulatory Visit: Payer: Self-pay | Admitting: Cardiology
